# Patient Record
Sex: Male | Born: 1965 | Race: White | Hispanic: No | Marital: Married | Smoking: Never smoker
Health system: Southern US, Community
[De-identification: ages and names within clinical notes are randomized; demographics above are authoritative.]

## PROBLEM LIST (undated history)

## (undated) DIAGNOSIS — E079 Disorder of thyroid, unspecified: Secondary | ICD-10-CM

## (undated) DIAGNOSIS — E119 Type 2 diabetes mellitus without complications: Secondary | ICD-10-CM

## (undated) DIAGNOSIS — E785 Hyperlipidemia, unspecified: Secondary | ICD-10-CM

## (undated) DIAGNOSIS — I10 Essential (primary) hypertension: Secondary | ICD-10-CM

## (undated) DIAGNOSIS — J189 Pneumonia, unspecified organism: Secondary | ICD-10-CM

## (undated) DIAGNOSIS — G4730 Sleep apnea, unspecified: Secondary | ICD-10-CM

## (undated) HISTORY — DX: Pneumonia, unspecified organism: J18.9

## (undated) HISTORY — DX: Essential (primary) hypertension: I10

## (undated) HISTORY — DX: Type 2 diabetes mellitus without complications: E11.9

## (undated) HISTORY — DX: Hyperlipidemia, unspecified: E78.5

## (undated) HISTORY — DX: Sleep apnea, unspecified: G47.30

## (undated) HISTORY — DX: Disorder of thyroid, unspecified: E07.9

---
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
## 2000-12-22 ENCOUNTER — Encounter: Payer: Self-pay | Admitting: Family Medicine

## 2000-12-22 ENCOUNTER — Encounter: Admission: RE | Admit: 2000-12-22 | Discharge: 2000-12-22 | Payer: Self-pay | Admitting: Family Medicine

## 2003-03-24 ENCOUNTER — Emergency Department (HOSPITAL_COMMUNITY): Admission: AD | Admit: 2003-03-24 | Discharge: 2003-03-24 | Payer: Self-pay | Admitting: Emergency Medicine

## 2003-06-24 ENCOUNTER — Inpatient Hospital Stay (HOSPITAL_COMMUNITY): Admission: EM | Admit: 2003-06-24 | Discharge: 2003-06-30 | Payer: Self-pay | Admitting: Emergency Medicine

## 2003-06-27 ENCOUNTER — Encounter (INDEPENDENT_AMBULATORY_CARE_PROVIDER_SITE_OTHER): Payer: Self-pay | Admitting: *Deleted

## 2003-06-29 ENCOUNTER — Ambulatory Visit (HOSPITAL_BASED_OUTPATIENT_CLINIC_OR_DEPARTMENT_OTHER): Admission: RE | Admit: 2003-06-29 | Discharge: 2003-06-29 | Payer: Self-pay | Admitting: Internal Medicine

## 2003-07-05 ENCOUNTER — Encounter: Admission: RE | Admit: 2003-07-05 | Discharge: 2003-07-05 | Payer: Self-pay | Admitting: Internal Medicine

## 2005-12-02 IMAGING — CR DG CHEST 2V
2 series · 2 of 2 positions shown · non-contrast
Comparison: none

CLINICAL DATA: Shortness of breath.
 TWO-VIEW CHEST, 06/25/03
 There are accentuated bronchial markings consistent with bronchitic changes.  There are slight infiltrative changes seen within the medial right lower lobe (shown best on patient?s recent chest CT scan).  The heart is borderline in size.
 IMPRESSION 
 Bronchitic changes and mild patchy infiltrate/atelectasis medial right lower lobe.

[view not recorded (1 of 2)]
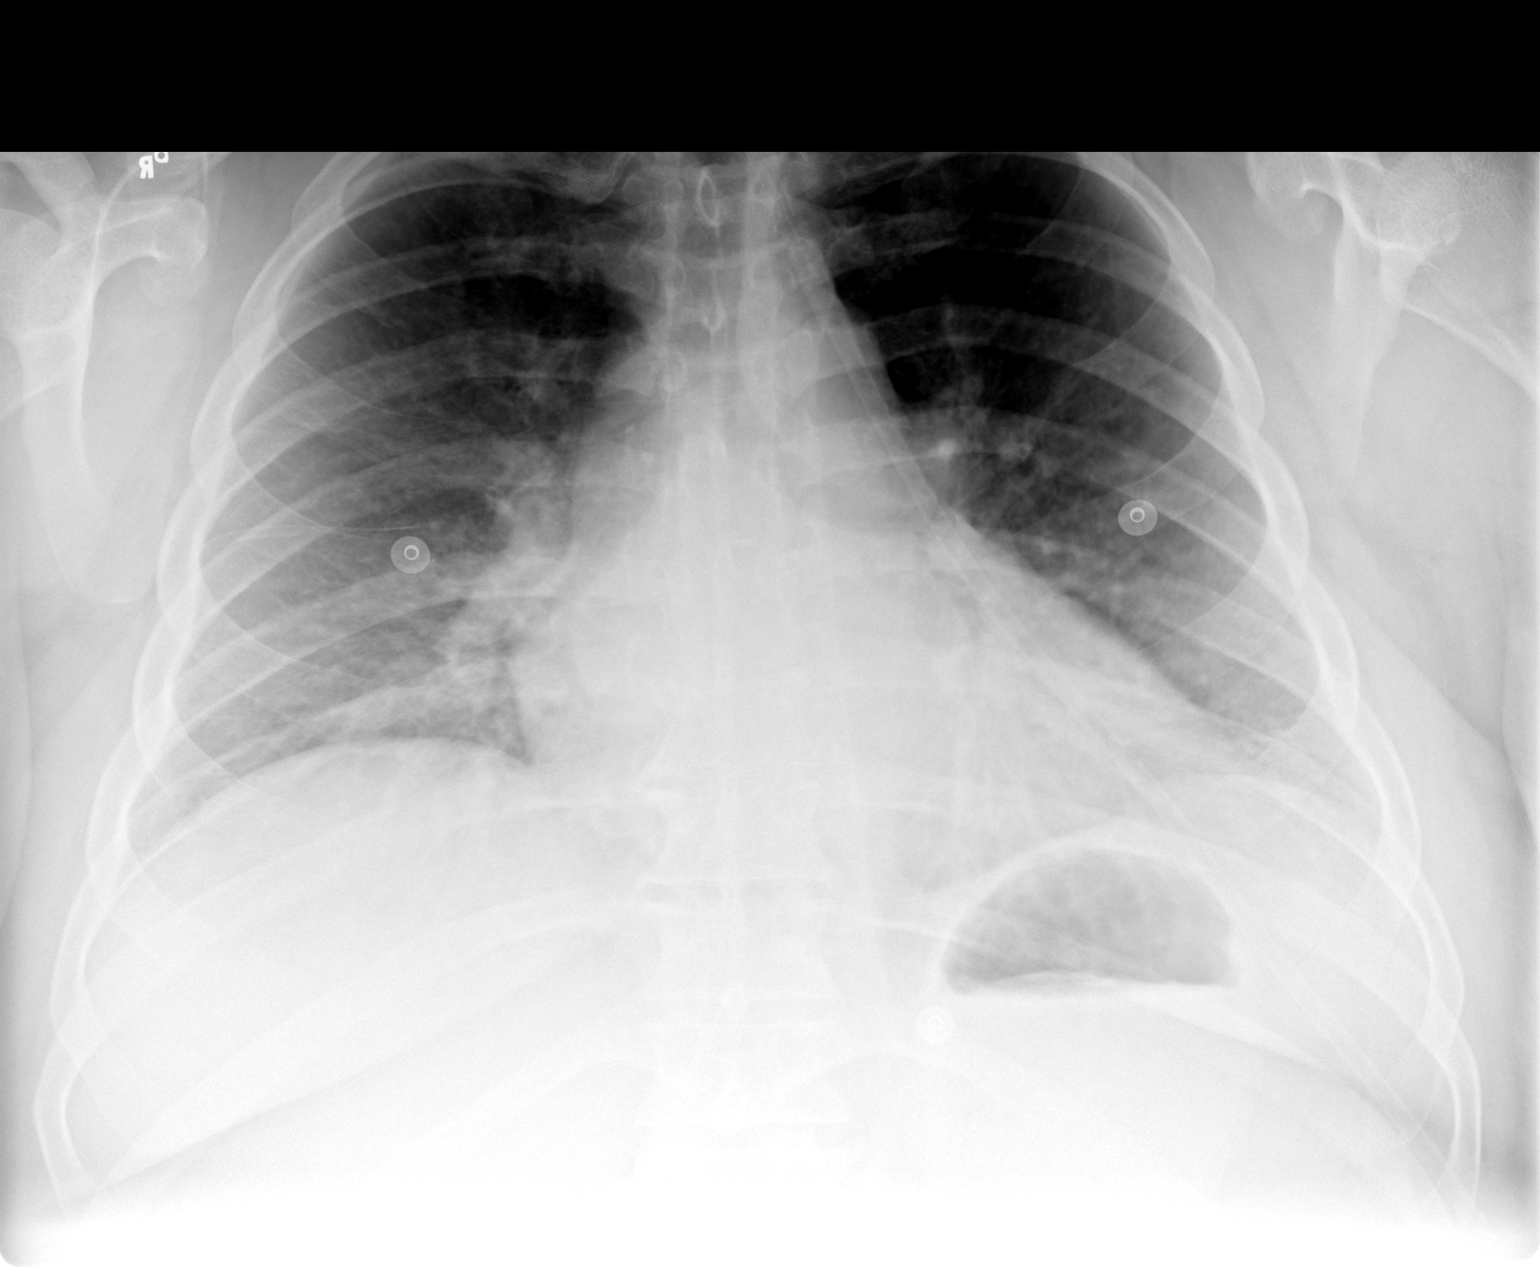

[view not recorded (2 of 2)]
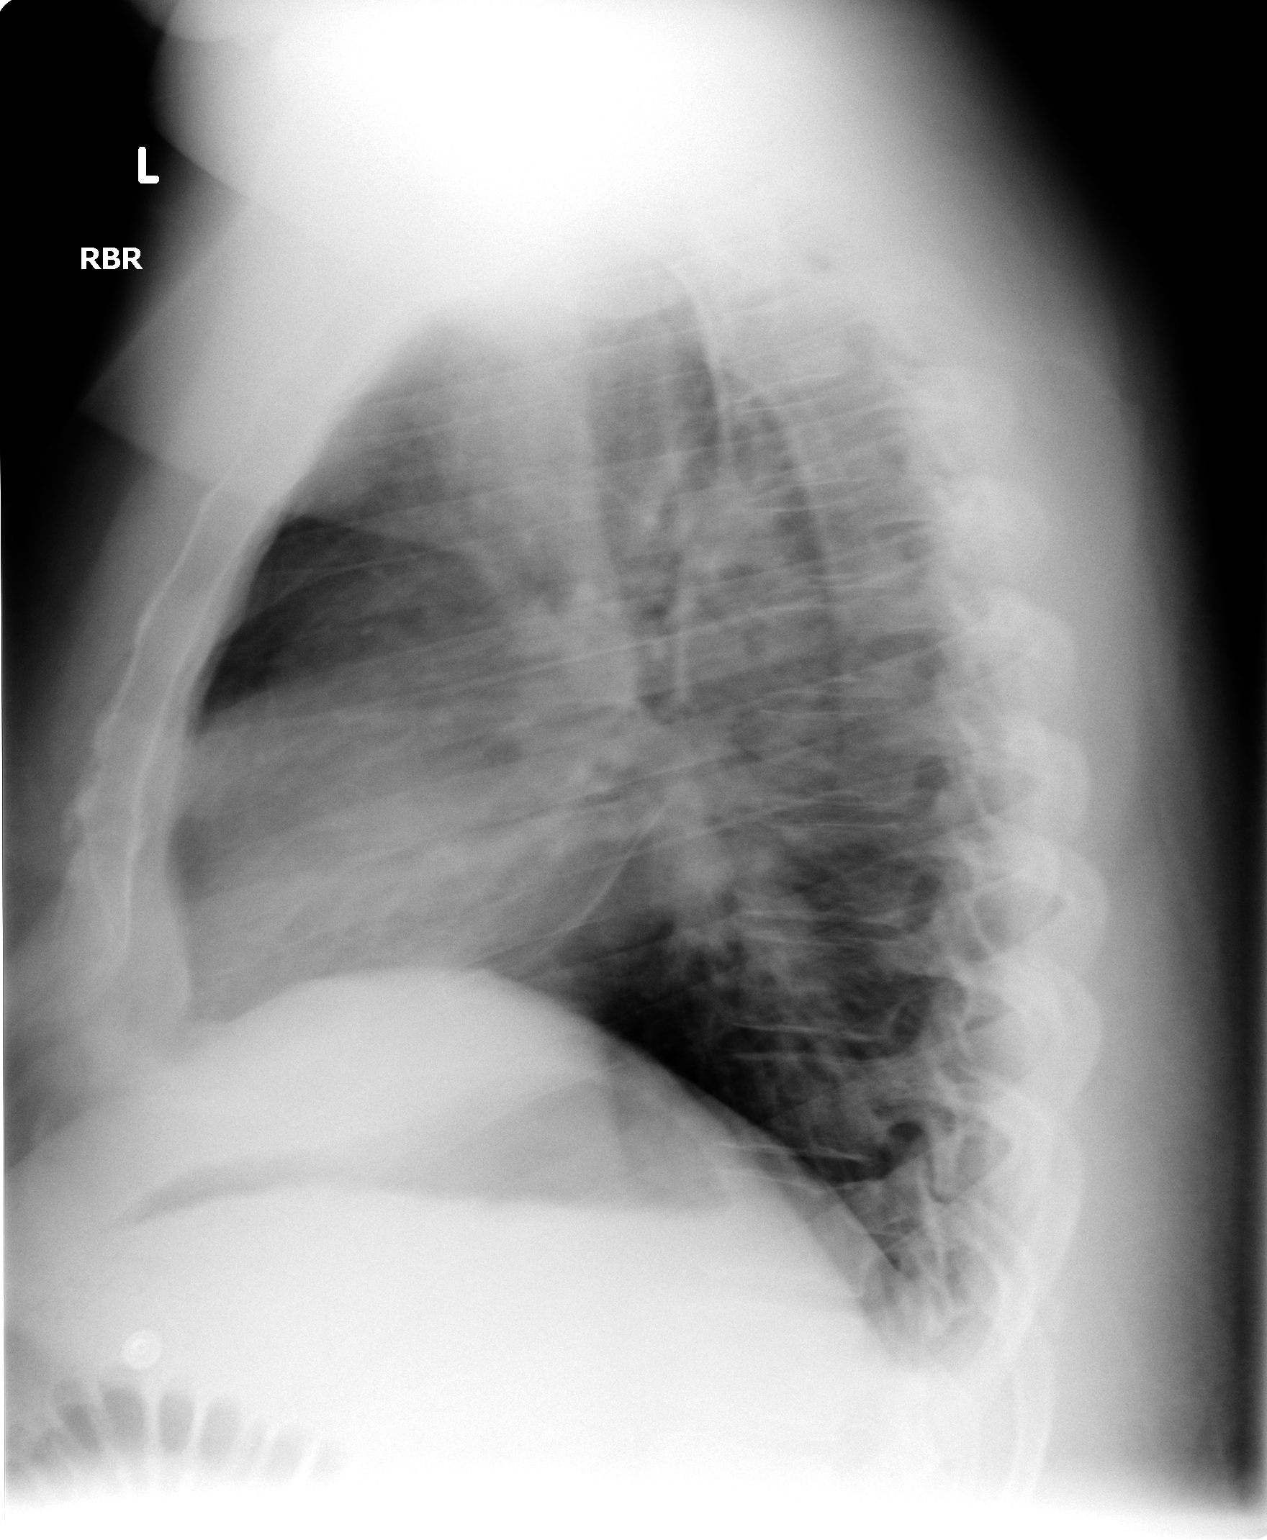

[2 of 2 positions shown; findings below may reference images not displayed]

## 2007-05-14 ENCOUNTER — Ambulatory Visit: Payer: Self-pay | Admitting: Family Medicine

## 2007-05-14 DIAGNOSIS — G4730 Sleep apnea, unspecified: Secondary | ICD-10-CM

## 2007-05-14 DIAGNOSIS — G4733 Obstructive sleep apnea (adult) (pediatric): Secondary | ICD-10-CM | POA: Insufficient documentation

## 2007-05-14 DIAGNOSIS — I10 Essential (primary) hypertension: Secondary | ICD-10-CM | POA: Insufficient documentation

## 2007-05-14 DIAGNOSIS — E039 Hypothyroidism, unspecified: Secondary | ICD-10-CM

## 2007-05-19 ENCOUNTER — Ambulatory Visit: Payer: Self-pay | Admitting: Family Medicine

## 2007-05-20 DIAGNOSIS — E785 Hyperlipidemia, unspecified: Secondary | ICD-10-CM

## 2007-05-20 DIAGNOSIS — E1169 Type 2 diabetes mellitus with other specified complication: Secondary | ICD-10-CM | POA: Insufficient documentation

## 2007-05-20 DIAGNOSIS — E1159 Type 2 diabetes mellitus with other circulatory complications: Secondary | ICD-10-CM | POA: Insufficient documentation

## 2007-05-20 DIAGNOSIS — E119 Type 2 diabetes mellitus without complications: Secondary | ICD-10-CM

## 2007-05-20 LAB — CONVERTED CEMR LAB
ALT: 37 units/L (ref 0–53)
AST: 28 units/L (ref 0–37)
Albumin: 3.6 g/dL (ref 3.5–5.2)
Alkaline Phosphatase: 53 units/L (ref 39–117)
BUN: 13 mg/dL (ref 6–23)
Basophils Absolute: 0 10*3/uL (ref 0.0–0.1)
Basophils Relative: 0.3 % (ref 0.0–1.0)
Bilirubin, Direct: 0.1 mg/dL (ref 0.0–0.3)
CO2: 26 meq/L (ref 19–32)
Calcium: 9.4 mg/dL (ref 8.4–10.5)
Chloride: 101 meq/L (ref 96–112)
Cholesterol: 204 mg/dL (ref 0–200)
Creatinine, Ser: 1 mg/dL (ref 0.4–1.5)
Direct LDL: 150.4 mg/dL
Eosinophils Absolute: 0.1 10*3/uL (ref 0.0–0.6)
Eosinophils Relative: 1.4 % (ref 0.0–5.0)
GFR calc Af Amer: 107 mL/min
GFR calc non Af Amer: 88 mL/min
Glucose, Bld: 126 mg/dL — ABNORMAL HIGH (ref 70–99)
HCT: 42.3 % (ref 39.0–52.0)
HDL: 33.3 mg/dL — ABNORMAL LOW (ref >39.0)
Hemoglobin: 13.9 g/dL (ref 13.0–17.0)
Lymphocytes Relative: 37.3 % (ref 12.0–46.0)
MCHC: 32.9 g/dL (ref 30.0–36.0)
MCV: 87.7 fL (ref 78.0–100.0)
Monocytes Absolute: 0.8 10*3/uL — ABNORMAL HIGH (ref 0.2–0.7)
Monocytes Relative: 7.5 % (ref 3.0–11.0)
Neutro Abs: 5.8 10*3/uL (ref 1.4–7.7)
Neutrophils Relative %: 53.5 % (ref 43.0–77.0)
Platelets: 248 10*3/uL (ref 150–400)
Potassium: 4.2 meq/L (ref 3.5–5.1)
RBC: 4.83 M/uL (ref 4.22–5.81)
RDW: 12.9 % (ref 11.5–14.6)
Sodium: 137 meq/L (ref 135–145)
TSH: 52.65 microintl units/mL — ABNORMAL HIGH (ref 0.35–5.50)
Total Bilirubin: 0.7 mg/dL (ref 0.3–1.2)
Total CHOL/HDL Ratio: 6.1
Total Protein: 7.7 g/dL (ref 6.0–8.3)
Triglycerides: 101 mg/dL (ref 0–149)
VLDL: 20 mg/dL (ref 0–40)
WBC: 10.7 10*3/uL — ABNORMAL HIGH (ref 4.5–10.5)

## 2007-06-05 ENCOUNTER — Ambulatory Visit: Payer: Self-pay | Admitting: Family Medicine

## 2007-07-02 ENCOUNTER — Ambulatory Visit: Payer: Self-pay | Admitting: Family Medicine

## 2007-07-06 ENCOUNTER — Telehealth: Payer: Self-pay | Admitting: Family Medicine

## 2007-07-06 LAB — CONVERTED CEMR LAB
BUN: 15 mg/dL (ref 6–23)
CO2: 28 meq/L (ref 19–32)
Calcium: 9.1 mg/dL (ref 8.4–10.5)
Chloride: 103 meq/L (ref 96–112)
Creatinine, Ser: 0.8 mg/dL (ref 0.4–1.5)
GFR calc Af Amer: 138 mL/min
GFR calc non Af Amer: 114 mL/min
Glucose, Bld: 143 mg/dL — ABNORMAL HIGH (ref 70–99)
Potassium: 4.1 meq/L (ref 3.5–5.1)
Sodium: 138 meq/L (ref 135–145)
TSH: 10.13 microintl units/mL — ABNORMAL HIGH (ref 0.35–5.50)

## 2007-08-19 ENCOUNTER — Ambulatory Visit: Payer: Self-pay | Admitting: Family Medicine

## 2007-08-23 LAB — CONVERTED CEMR LAB: TSH: 10.61 microintl units/mL — ABNORMAL HIGH (ref 0.35–5.50)

## 2007-09-08 ENCOUNTER — Ambulatory Visit: Payer: Self-pay | Admitting: Family Medicine

## 2007-09-10 ENCOUNTER — Ambulatory Visit: Payer: Self-pay | Admitting: Family Medicine

## 2007-09-10 LAB — CONVERTED CEMR LAB
Cholesterol, target level: 200 mg/dL
HDL goal, serum: 40 mg/dL
LDL Goal: 100 mg/dL

## 2007-09-11 LAB — CONVERTED CEMR LAB
ALT: 41 units/L (ref 0–53)
AST: 30 units/L (ref 0–37)
Albumin: 3.1 g/dL — ABNORMAL LOW (ref 3.5–5.2)
Alkaline Phosphatase: 56 units/L (ref 39–117)
Bilirubin, Direct: 0.1 mg/dL (ref 0.0–0.3)
Cholesterol: 145 mg/dL (ref 0–200)
Creatinine,U: 200.9 mg/dL
HDL: 28.4 mg/dL — ABNORMAL LOW (ref >39.0)
Hgb A1c MFr Bld: 7.6 % — ABNORMAL HIGH (ref 4.6–6.0)
LDL Cholesterol: 97 mg/dL (ref 0–99)
Microalb Creat Ratio: 2.5 mg/g (ref 0.0–30.0)
Microalb, Ur: 0.5 mg/dL (ref 0.0–1.9)
Total Bilirubin: 0.8 mg/dL (ref 0.3–1.2)
Total CHOL/HDL Ratio: 5.1
Total Protein: 7.6 g/dL (ref 6.0–8.3)
Triglycerides: 100 mg/dL (ref 0–149)
VLDL: 20 mg/dL (ref 0–40)

## 2007-10-01 ENCOUNTER — Ambulatory Visit: Payer: Self-pay | Admitting: Family Medicine

## 2007-10-15 ENCOUNTER — Ambulatory Visit: Payer: Self-pay | Admitting: Family Medicine

## 2007-10-20 LAB — CONVERTED CEMR LAB: TSH: 2.96 microintl units/mL (ref 0.35–5.50)

## 2007-12-09 ENCOUNTER — Encounter (INDEPENDENT_AMBULATORY_CARE_PROVIDER_SITE_OTHER): Payer: Self-pay | Admitting: *Deleted

## 2007-12-14 ENCOUNTER — Encounter (INDEPENDENT_AMBULATORY_CARE_PROVIDER_SITE_OTHER): Payer: Self-pay | Admitting: *Deleted

## 2007-12-21 ENCOUNTER — Telehealth: Payer: Self-pay | Admitting: Family Medicine

## 2007-12-23 ENCOUNTER — Ambulatory Visit: Payer: Self-pay | Admitting: Family Medicine

## 2007-12-23 LAB — CONVERTED CEMR LAB: Hgb A1c MFr Bld: 7.5 % — ABNORMAL HIGH (ref 4.6–6.0)

## 2007-12-29 ENCOUNTER — Ambulatory Visit: Payer: Self-pay | Admitting: Family Medicine

## 2009-05-02 ENCOUNTER — Telehealth (INDEPENDENT_AMBULATORY_CARE_PROVIDER_SITE_OTHER): Payer: Self-pay | Admitting: *Deleted

## 2009-05-03 ENCOUNTER — Ambulatory Visit: Payer: Self-pay | Admitting: Family Medicine

## 2010-04-20 ENCOUNTER — Encounter: Payer: Self-pay | Admitting: Family Medicine

## 2010-04-29 LAB — CONVERTED CEMR LAB: Hgb A1c MFr Bld: 7.5 % — ABNORMAL HIGH (ref 4.6–6.0)

## 2010-05-01 NOTE — Progress Notes (Signed)
Summary: made app for tomorrow  Phone Note Call from Patient   Caller: Patient Call For: Kerby Nora MD Summary of Call: Patient called requesting appointment for today or medication be called in for him. He said that he has had a cold for the past 4 days, he has had cough, had fever last Thursday. He says that he needs something for the cough because it will just not go away.  I scheduled him an appointment wiht Dr. Dayton Martes for tomorrow. He said that he was okay with that.  Initial call taken by: Melody Comas,  May 02, 2009 10:37 AM

## 2010-05-01 NOTE — Assessment & Plan Note (Signed)
Summary: cold, chest rattle, coughing   Vital Signs:  Patient profile:   45 year old male Height:      72 inches Weight:      423.25 pounds BMI:     57.61 Temp:     98.7 degrees F oral Pulse rate:   92 / minute Pulse rhythm:   regular BP sitting:   132 / 90  (left arm) Cuff size:   large  Vitals Entered By: Delilah Shan CMA Duncan Dull) (May 03, 2009 10:35 AM) CC: Cold, chest rattling, coughing   History of Present Illness: 45 yo with 8 days of subjective fevers, chills, runny nose, and productive cough. Fevers have gone away but feels cough and wheezing has gotten worse over past couple of days. Trying OTC mucinex with no relief of symptoms. No shortness of breath. NO sore throat or ear pain. No n/v/d.  Current Medications (verified): 1)  Synthroid 200 Mcg  Tabs (Levothyroxine Sodium) .... Take 1 Tablet By Mouth Once A Day 2)  Lisinopril-Hydrochlorothiazide 10-12.5 Mg  Tabs (Lisinopril-Hydrochlorothiazide) .Marland Kitchen.. 1 Tab By Mouth Daily 3)  Simvastatin 40 Mg  Tabs (Simvastatin) .... Take 1 Tablet By Mouth Once A Day 4)  Metformin Hcl 1000 Mg Tabs (Metformin Hcl) .Marland Kitchen.. 1 Tab By Mouth Two Times A Day 5)  Synthroid 50 Mcg  Tabs (Levothyroxine Sodium) .... Take One Tablet Daily 6)  Azithromycin 250 Mg  Tabs (Azithromycin) .... 2 By  Mouth Today and Then 1 Daily For 4 Days 7)  Cheratussin Ac 100-10 Mg/32ml Syrp (Guaifenesin-Codeine) .... 5 Ml Two Times A Day As Needed For Cough  Allergies (verified): No Known Drug Allergies  Review of Systems      See HPI General:  Complains of chills and fever. ENT:  Complains of nasal congestion and sinus pressure; denies earache and sore throat. CV:  Denies chest pain or discomfort. Resp:  Complains of cough and sputum productive; denies shortness of breath and wheezing. GI:  Denies diarrhea, nausea, and vomiting.  Physical Exam  General:  morbidly obese male in NAD Non toxic. VS reviewed- afebrile and normotensive. Ears:  TM retracted  bilaterally. Nose:  nasal dischargemucosal pallor.   Mouth:  Oral mucosa and oropharynx without lesions or exudates.  Teeth in good repair. MMM Lungs:  Normal respiratory effort, chest expands symmetrically. No crackles. Occassional exp wheeze. Heart:  Normal rate and regular rhythm. S1 and S2 normal without gallop, murmur, click, rub or other extra sounds. Extremities:  no edema Psych:  Cognition and judgment appear intact. Alert and cooperative with normal attention span and concentration. No apparent delusions, illusions, hallucinations   Impression & Recommendations:  Problem # 1:  ACUTE BRONCHITIS (ICD-466.0) Assessment New Given duration of symptoms, will treat with Zpack. Chertussin for cough. Continue supportive care.  See patient instructions for details. The following medications were removed from the medication list:    Cheratussin Ac 100-10 Mg/3ml Syrp (Guaifenesin-codeine) .Marland Kitchen... 1-2 tsp by mouth at bedtime as needed cough His updated medication list for this problem includes:    Azithromycin 250 Mg Tabs (Azithromycin) .Marland Kitchen... 2 by  mouth today and then 1 daily for 4 days    Cheratussin Ac 100-10 Mg/46ml Syrp (Guaifenesin-codeine) .Marland KitchenMarland KitchenMarland KitchenMarland Kitchen 5 ml two times a day as needed for cough  Complete Medication List: 1)  Synthroid 200 Mcg Tabs (Levothyroxine sodium) .... Take 1 tablet by mouth once a day 2)  Lisinopril-hydrochlorothiazide 10-12.5 Mg Tabs (Lisinopril-hydrochlorothiazide) .Marland Kitchen.. 1 tab by mouth daily 3)  Simvastatin 40 Mg  Tabs (Simvastatin) .... Take 1 tablet by mouth once a day 4)  Metformin Hcl 1000 Mg Tabs (Metformin hcl) .Marland Kitchen.. 1 tab by mouth two times a day 5)  Synthroid 50 Mcg Tabs (Levothyroxine sodium) .... Take one tablet daily 6)  Azithromycin 250 Mg Tabs (Azithromycin) .... 2 by  mouth today and then 1 daily for 4 days 7)  Cheratussin Ac 100-10 Mg/62ml Syrp (Guaifenesin-codeine) .... 5 ml two times a day as needed for cough  Patient Instructions: 1)  Take antibiotic as  directed.  Drink lots of fluids.  Treat sympotmatically with Mucinex, nasal saline irrigation, and Tylenol/Ibuprofen. You can also try using warm compresses.  Cough suppressant at night. Call if not improving as expected in 5-7 days.  Prescriptions: CHERATUSSIN AC 100-10 MG/5ML SYRP (GUAIFENESIN-CODEINE) 5 ml two times a day as needed for cough  #4 ounces x 0   Entered and Authorized by:   Ruthe Mannan MD   Signed by:   Ruthe Mannan MD on 05/03/2009   Method used:   Print then Give to Patient   RxID:   [id number] AZITHROMYCIN 250 MG  TABS (AZITHROMYCIN) 2 by  mouth today and then 1 daily for 4 days  #6 x 0   Entered and Authorized by:   Ruthe Mannan MD   Signed by:   Ruthe Mannan MD on 05/03/2009   Method used:   Electronically to        [id number] * (retail)       [address]       [place], [place]  [address]       Ph: [phone] or [phone]       Fax: [phone]   RxID:   [id number]   Current Allergies (reviewed today): No known allergies

## 2010-05-17 NOTE — Letter (Signed)
Summary: [facility]  [facility]   Imported By: Kassie Mends 05/07/2010 11:02:54  _____________________________________________________________________  External Attachment:    Type:   Image     Comment:   External Document

## 2010-06-01 ENCOUNTER — Telehealth (INDEPENDENT_AMBULATORY_CARE_PROVIDER_SITE_OTHER): Payer: Self-pay | Admitting: *Deleted

## 2010-06-04 ENCOUNTER — Other Ambulatory Visit: Payer: Self-pay | Admitting: Family Medicine

## 2010-06-04 ENCOUNTER — Encounter (INDEPENDENT_AMBULATORY_CARE_PROVIDER_SITE_OTHER): Payer: Self-pay | Admitting: *Deleted

## 2010-06-04 ENCOUNTER — Other Ambulatory Visit (INDEPENDENT_AMBULATORY_CARE_PROVIDER_SITE_OTHER): Payer: BC Managed Care – PPO

## 2010-06-04 DIAGNOSIS — E039 Hypothyroidism, unspecified: Secondary | ICD-10-CM

## 2010-06-04 DIAGNOSIS — E119 Type 2 diabetes mellitus without complications: Secondary | ICD-10-CM

## 2010-06-04 DIAGNOSIS — E785 Hyperlipidemia, unspecified: Secondary | ICD-10-CM

## 2010-06-04 DIAGNOSIS — I10 Essential (primary) hypertension: Secondary | ICD-10-CM

## 2010-06-04 LAB — HEPATIC FUNCTION PANEL
ALT: 15 U/L (ref 0–53)
AST: 19 U/L (ref 0–37)
Albumin: 3.8 g/dL (ref 3.5–5.2)
Alkaline Phosphatase: 53 U/L (ref 39–117)
Bilirubin, Direct: 0.1 mg/dL (ref 0.0–0.3)
Total Bilirubin: 0.7 mg/dL (ref 0.3–1.2)
Total Protein: 7.3 g/dL (ref 6.0–8.3)

## 2010-06-04 LAB — LIPID PANEL
Cholesterol: 144 mg/dL (ref 0–200)
HDL: 35.9 mg/dL — ABNORMAL LOW (ref >39.00)
LDL Cholesterol: 97 mg/dL (ref 0–99)
Total CHOL/HDL Ratio: 4
Triglycerides: 55 mg/dL (ref 0.0–149.0)
VLDL: 11 mg/dL (ref 0.0–40.0)

## 2010-06-04 LAB — BASIC METABOLIC PANEL
BUN: 12 mg/dL (ref 6–23)
CO2: 26 mEq/L (ref 19–32)
Calcium: 9 mg/dL (ref 8.4–10.5)
Chloride: 103 mEq/L (ref 96–112)
Creatinine, Ser: 0.8 mg/dL (ref 0.4–1.5)
GFR: 117.41 mL/min (ref >60.00)
Glucose, Bld: 98 mg/dL (ref 70–99)
Potassium: 4.2 mEq/L (ref 3.5–5.1)
Sodium: 137 mEq/L (ref 135–145)

## 2010-06-04 LAB — TSH: TSH: 7.41 u[IU]/mL — ABNORMAL HIGH (ref 0.35–5.50)

## 2010-06-04 LAB — HEMOGLOBIN A1C: Hgb A1c MFr Bld: 6.6 % — ABNORMAL HIGH (ref 4.6–6.5)

## 2010-06-05 ENCOUNTER — Telehealth: Payer: Self-pay | Admitting: Family Medicine

## 2010-06-07 NOTE — Progress Notes (Signed)
----   Converted from flag ---- ---- 05/29/2010 1:56 PM, Kerby Nora MD wrote: CMET, lipids A1C, microalbumin, TSH Dx 250.00, 244.9  ---- 05/29/2010 1:28 PM, Liane Comber CMA (AAMA) wrote: Lab orders please! Good Morning! This pt is scheduled for cpx labs Monday, which labs to draw and dx codes to use? Thanks Tasha ------------------------------

## 2010-06-08 ENCOUNTER — Encounter: Payer: Self-pay | Admitting: Family Medicine

## 2010-06-08 ENCOUNTER — Encounter (INDEPENDENT_AMBULATORY_CARE_PROVIDER_SITE_OTHER): Payer: BC Managed Care – PPO | Admitting: Family Medicine

## 2010-06-08 DIAGNOSIS — Z0000 Encounter for general adult medical examination without abnormal findings: Secondary | ICD-10-CM

## 2010-06-11 ENCOUNTER — Encounter: Payer: Self-pay | Admitting: Family Medicine

## 2010-06-12 NOTE — Progress Notes (Signed)
  Phone Note Outgoing Call   Call placed by: twalsh Summary of Call: FYI patient never brought his urine sample back for a Microalbumin, it was cancelled. I will reorder if he brings it in.  Initial call taken by: Mills Koller,  June 05, 2010 2:56 PM  Follow-up for Phone Call        Has upcoming appt 3/9.. . of note he is on ACEI already... can hold off on microalbumin check  Follow-up by: Kerby Nora MD,  June 05, 2010 3:32 PM

## 2010-06-19 NOTE — Assessment & Plan Note (Signed)
Vital Signs:  Patient profile:   45 year old male Height:      72 inches Weight:      354.75 pounds BMI:     48.29 Temp:     97.6 degrees F oral Pulse rate:   88 / minute Pulse rhythm:   regular BP sitting:   110 / 72  (left arm) Cuff size:   large  Vitals Entered By: Benny Lennert CMA Duncan Dull) (June 08, 2010 11:54 AM)  History of Present Illness: Chief complaint cpx  The patient is here for annual wellness exam and preventative care.    He has not been seen since 2009. Has lost 80 lbs in last 3 years.  Regualr exercsie and on diet.    DM: well controlled on metformin    HTN, well controlled...on lisinopril/HCTZ  High cholesterol, well controlled on simvastatin   Hypothyroid.. undertreated on current dose of synthroid.... but has only been using 200 micrograms.. not the additional 50 micrograms.   Dx with sleep apnea 8 years ago... on CPAP...has not had reeval in 8 years.  Hypertension History:      He denies headache, chest pain, palpitations, dyspnea with exertion, peripheral edema, neurologic problems, syncope, and side effects from treatment.        Positive major cardiovascular risk factors include diabetes, hyperlipidemia, and hypertension.  Negative major cardiovascular risk factors include male age less than 56 years old and non-tobacco-user status.    Lipid Management History:      Positive NCEP/ATP III risk factors include diabetes, HDL cholesterol less than 40, and hypertension.  Negative NCEP/ATP III risk factors include male age less than 36 years old and non-tobacco-user status.       Problems Prior to Update: 1)  Acute Bronchitis  (ICD-466.0) 2)  Hyperlipidemia  (ICD-272.4) 3)  Diabetes Mellitus, Type II, Without Complications  (ICD-250.00) 4)  Morbid Obesity  (ICD-278.01) 5)  Family History of Cad Male 1st Degree Relative <50  (ICD-V17.3) 6)  Sleep Apnea  (ICD-780.57) 7)  Hypothyroidism  (ICD-244.9) 8)  Hypertension  (ICD-401.9)  Current  Medications (verified): 1)  Synthroid 200 Mcg  Tabs (Levothyroxine Sodium) .... Take 1 Tablet By Mouth Once A Day 2)  Lisinopril-Hydrochlorothiazide 10-12.5 Mg  Tabs (Lisinopril-Hydrochlorothiazide) .Marland Kitchen.. 1 Tab By Mouth Daily 3)  Simvastatin 40 Mg  Tabs (Simvastatin) .... Take 1 Tablet By Mouth Once A Day 4)  Metformin Hcl 1000 Mg Tabs (Metformin Hcl) .Marland Kitchen.. 1 Tab By Mouth Two Times A Day 5)  Synthroid 50 Mcg  Tabs (Levothyroxine Sodium) .... Take One Tablet Daily  Allergies (verified): No Known Drug Allergies  Past History:  Past medical, surgical, family and social histories (including risk factors) reviewed, and no changes noted (except as noted below).  Past Medical History: Reviewed history from 05/14/2007 and no changes required. Hypertension Hypothyroidism  Past Surgical History: Reviewed history from 05/14/2007 and no changes required. Hospitalization 2006 HTN, PNA Tonsillectomy  Family History: Reviewed history from 05/14/2007 and no changes required. mother: DM, HTN father: DM, HTN, MI at age 27 PGM: DM Family History of CAD Male 1st degree relative <50 no cancer  Social History: Reviewed history from 05/14/2007 and no changes required. Occupation:lab tech for [employer] Married Childern: healthy Never Smoked Alcohol use-no Drug use-no Regular exercise-no Diet: eats on the run, some fast food, working on changing  Diabetes Management Exam:    Foot Exam (with socks and/or shoes not present):       Sensory-Pinprick/Light touch:  Left medial foot (L-4): normal          Left dorsal foot (L-5): normal          Left lateral foot (S-1): normal          Right medial foot (L-4): normal          Right dorsal foot (L-5): normal          Right lateral foot (S-1): normal       Sensory-Monofilament:          Left foot: normal          Right foot: normal       Inspection:          Left foot: normal          Right foot: normal       Nails:          Left foot:  normal          Right foot: normal    Eye Exam:       Eye Exam done elsewhere          Date: 10/30/2009          Results: normal          Done by: eye MD    Impression & Recommendations:  Problem # 1:  Preventive Health Care (ICD-V70.0) The patient's preventative maintenance and recommended screening tests for an annual wellness exam were reviewed in full today. Brought up to date unless services declined.  Counselled on the importance of diet, exercise, and its role in overall health and mortality. The patient's FH and SH was reviewed, including their home life, tobacco status, and drug and alcohol status.   Given his morbid obesity.. I stressed the improtance of weight loss for overall health.Marland Kitchen all his healthy issues could improve with weight loss. Encouraged exercise, weight loss, healthy eating habits.     Problem # 2:  HYPERLIPIDEMIA (ICD-272.4) Well controlled. Continue current medication.  His updated medication list for this problem includes:    Simvastatin 40 Mg Tabs (Simvastatin) .Marland Kitchen... Take 1 tablet by mouth once a day  Labs Reviewed: SGOT: 19 (06/04/2010)   SGPT: 15 (06/04/2010)  Lipid Goals: Chol Goal: 200 (09/10/2007)   HDL Goal: 40 (09/10/2007)   LDL Goal: 100 (09/10/2007)   TG Goal: 150 (09/10/2007)  10 Yr Risk Heart Disease: 4 % Prior 10 Yr Risk Heart Disease: 6 % (12/29/2007)   HDL:35.90 (06/04/2010), 28.4 (09/08/2007)  LDL:97 (06/04/2010), 97 (09/08/2007)  Chol:144 (06/04/2010), 145 (09/08/2007)  Trig:55.0 (06/04/2010), 100 (09/08/2007)  Problem # 3:  DIABETES MELLITUS, TYPE II, WITHOUT COMPLICATIONS (ICD-250.00) Well controlled. Continue current medication.  His updated medication list for this problem includes:    Lisinopril-hydrochlorothiazide 10-12.5 Mg Tabs (Lisinopril-hydrochlorothiazide) .Marland Kitchen... 1 tab by mouth daily    Metformin Hcl 1000 Mg Tabs (Metformin hcl) .Marland Kitchen... 1 tab by mouth two times a day  Problem # 4:  HYPERTENSION (ICD-401.9) Well  controlled. Continue current medication.  His updated medication list for this problem includes:    Lisinopril-hydrochlorothiazide 10-12.5 Mg Tabs (Lisinopril-hydrochlorothiazide) .Marland Kitchen... 1 tab by mouth daily  BP today: 110/72 Prior BP: 132/90 (05/03/2009)  10 Yr Risk Heart Disease: 4 % Prior 10 Yr Risk Heart Disease: 6 % (12/29/2007)  Labs Reviewed: K+: 4.2 (06/04/2010) Creat: : 0.8 (06/04/2010)   Chol: 144 (06/04/2010)   HDL: 35.90 (06/04/2010)   LDL: 97 (06/04/2010)   TG: 55.0 (06/04/2010)  Problem # 5:  HYPOTHYROIDISM (ICD-244.9) Undertreated but not  taking recommended amount of  medication.  Get back on 250 micrograms daily of synthroid. His updated medication list for this problem includes:    Synthroid 200 Mcg Tabs (Levothyroxine sodium) .Marland Kitchen... Take 1 tablet by mouth once a day    Synthroid 50 Mcg Tabs (Levothyroxine sodium) .Marland Kitchen... Take one tablet daily  Problem # 6:  SLEEP APNEA (ICD-780.57) Given 80 lbs weight loss.. he need adjustment of CPAP for correct pressures, fit etc. Referral made.  Orders: Sleep Disorder Referral (Sleep Disorder)  Complete Medication List: 1)  Synthroid 200 Mcg Tabs (Levothyroxine sodium) .... Take 1 tablet by mouth once a day 2)  Lisinopril-hydrochlorothiazide 10-12.5 Mg Tabs (Lisinopril-hydrochlorothiazide) .Marland Kitchen.. 1 tab by mouth daily 3)  Simvastatin 40 Mg Tabs (Simvastatin) .... Take 1 tablet by mouth once a day 4)  Metformin Hcl 1000 Mg Tabs (Metformin hcl) .Marland Kitchen.. 1 tab by mouth two times a day 5)  Synthroid 50 Mcg Tabs (Levothyroxine sodium) .... Take one tablet daily  Hypertension Assessment/Plan:      The patient's hypertensive risk group is category C: Target organ damage and/or diabetes.  His calculated 10 year risk of coronary heart disease is 4 %.  Today's blood pressure is 110/72.  His blood pressure goal is < 130/80.  Lipid Assessment/Plan:      Based on NCEP/ATP III, the patient's risk factor category is "history of diabetes".  The  patient's lipid goals are as follows: Total cholesterol goal is 200; LDL cholesterol goal is 100; HDL cholesterol goal is 40; Triglyceride goal is 150.  His LDL cholesterol goal has been met.    Patient Instructions: 1)  COntinue excellent work on weight loss and diet changes. 2)   Continue regular exercsie.  3)  Restart 50 micrograms with the 200 micrograms. 4)   Recheck TSH  in 4-6 weeks Dx 244.9 5)   Please schedule a follow-up appointment in 6 months .  6)  BMP prior to visit, ICD-9: 250.00 7)  Hepatic Panel prior to visit ICD-9:  8)  Lipid panel prior to visit ICD-9 :  9)  HgBA1c prior to visit  ICD-9:  10)  Referral Appointment Information 11)  Day/Date: 12)  Time: 13)  Place/MD: 14)  Address: 15)  Phone/Fax: 16)  Patient given appointment information. Information/Orders faxed/mailed.    Orders Added: 1)  Sleep Disorder Referral [Sleep Disorder] 2)  Est. Patient 40-64 years [[id number]]    Current Allergies (reviewed today): No known allergies   Last Flu Vaccine:  Historical (12/31/2006 10:54:31 AM) Flu Vaccine Result Date:  12/30/2009 Flu Vaccine Result:  given Flu Vaccine Next Due:  1 yr    Past Medical History:    Reviewed history from 05/14/2007 and no changes required:       Hypertension       Hypothyroidism  Past Surgical History:    Reviewed history from 05/14/2007 and no changes required:       Hospitalization 2006 HTN, PNA       Tonsillectomy

## 2010-07-03 ENCOUNTER — Encounter: Payer: Self-pay | Admitting: Pulmonary Disease

## 2010-07-05 ENCOUNTER — Ambulatory Visit (INDEPENDENT_AMBULATORY_CARE_PROVIDER_SITE_OTHER): Payer: BC Managed Care – PPO | Admitting: Pulmonary Disease

## 2010-07-05 ENCOUNTER — Other Ambulatory Visit: Payer: Self-pay | Admitting: *Deleted

## 2010-07-05 ENCOUNTER — Encounter: Payer: Self-pay | Admitting: Pulmonary Disease

## 2010-07-05 VITALS — BP 100/76 | HR 68 | Temp 98.1°F | Ht 72.0 in | Wt 348.0 lb

## 2010-07-05 DIAGNOSIS — G4730 Sleep apnea, unspecified: Secondary | ICD-10-CM

## 2010-07-05 MED ORDER — LEVOTHYROXINE SODIUM 50 MCG PO TABS: 50.0000 ug | ORAL_TABLET | Freq: Every day | ORAL | Status: DC

## 2010-07-05 NOTE — Progress Notes (Signed)
  Subjective:    Patient ID: [name], male    DOB: 1965/05/07, 45 y.o.   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  HENT: Negative for ear pain, congestion, sore throat, rhinorrhea, sneezing, trouble swallowing, dental problem and postnasal drip.   Eyes: Negative for redness.  Respiratory: Negative for cough, shortness of breath and wheezing.   Cardiovascular: Negative for chest pain, palpitations and leg swelling.  Gastrointestinal: Negative for nausea, vomiting, abdominal pain and diarrhea.  Genitourinary: Negative for dysuria and urgency.  Musculoskeletal: Negative for joint swelling.  Skin: Negative for rash.  Neurological: Negative for syncope and headaches.  Hematological: Does not bruise/bleed easily.  Psychiatric/Behavioral: Negative for dysphoric mood. The patient is not nervous/anxious.        Objective:   Physical Exam Gen. Pleasant, well-nourished, in no distress, normal affect ENT - no lesions, no post nasal drip, class 3 airway Neck: No JVD, no thyromegaly, no carotid bruits Lungs: no use of accessory muscles, no dullness to percussion, clear without rales or rhonchi  Cardiovascular: Rhythm regular, heart sounds  normal, no murmurs or gallops, no peripheral edema Abdomen: soft and non-tender, no hepatosplenomegaly, BS  normal. Musculoskeletal: No deformities, no cyanosis or clubbing Neuro:  alert, non focal        Assessment & Plan:

## 2010-07-05 NOTE — Assessment & Plan Note (Addendum)
Rpt CPAP titration gioven wt loss of 80 lbs Given excessive daytime somnolence, narrow pharyngeal exam, witnessed apneas & loud snoring, obstructive sleep apnea is very likely & an overnight polysomnogram will be scheduled as a split study. The pathophysiology of obstructive sleep apnea , it's cardiovascular consequences & modes of treatment including CPAP were discused with the patient in detail & they evidenced understanding.  Weight loss encouraged, compliance with goal of at least 4-6 hrs every night is the expectation. Advised against medications with sedative side effects Cautioned against driving when sleepy - understanding that sleepiness will vary on a day to day basis

## 2010-07-05 NOTE — Patient Instructions (Signed)
We will set you up for  A cpap titration study  We will try to get you a new machine after this study

## 2010-08-04 ENCOUNTER — Other Ambulatory Visit: Payer: Self-pay | Admitting: Family Medicine

## 2010-08-06 ENCOUNTER — Other Ambulatory Visit: Payer: Self-pay | Admitting: *Deleted

## 2010-08-06 MED ORDER — METFORMIN HCL 1000 MG PO TABS: 500.0000 mg | ORAL_TABLET | Freq: Two times a day (BID) | ORAL | Status: DC

## 2010-08-07 ENCOUNTER — Ambulatory Visit (HOSPITAL_BASED_OUTPATIENT_CLINIC_OR_DEPARTMENT_OTHER): Payer: BC Managed Care – PPO | Attending: Pulmonary Disease

## 2010-08-07 DIAGNOSIS — Z79899 Other long term (current) drug therapy: Secondary | ICD-10-CM | POA: Insufficient documentation

## 2010-08-07 DIAGNOSIS — G4733 Obstructive sleep apnea (adult) (pediatric): Secondary | ICD-10-CM | POA: Insufficient documentation

## 2010-08-10 ENCOUNTER — Telehealth: Payer: Self-pay | Admitting: Pulmonary Disease

## 2010-08-10 DIAGNOSIS — Z9989 Dependence on other enabling machines and devices: Secondary | ICD-10-CM

## 2010-08-10 DIAGNOSIS — G4733 Obstructive sleep apnea (adult) (pediatric): Secondary | ICD-10-CM

## 2010-08-10 NOTE — Telephone Encounter (Signed)
Let him know sleep study showed CPAP requirement of 17 cm , with C-flex 2 cm  order sent for DME to change pressure

## 2010-08-11 NOTE — Procedures (Addendum)
[name], [name]              ACCOUNT NO.:  [id number]  MEDICAL RECORD NO.:  [id number]          PATIENT TYPE:  OUT  LOCATION:  SLEEP CENTER                 FACILITY:  [facility]  PHYSICIAN:  Oretha Milch, MD      DATE OF BIRTH:  1965/04/14  DATE OF STUDY:  08/07/2010                           NOCTURNAL POLYSOMNOGRAM  REFERRING PHYSICIAN:  RAKESH V. ALVA  INDICATION FOR STUDY:  [name] is a 45 year old gentleman with known obstructive sleep apnea diagnosed 8 years ago.  When his weight was 430 pounds, he was set up with a CPAP of 22 cm.  He has lost 100 pounds since then and hence this CPAP retitration was performed.  At the time of this study, he weighed 340 pounds with a height of 6 feet, BMI of 46, neck size of 16 inches.  EPWORTH SLEEPINESS SCORE:  6.  MEDICATIONS:  Include Synthroid, lisinopril, metformin.  This CPAP titration study was performed with a sleep technologist in attendance.  EEG, EOG, EMG, EKG, and respiratory parameters were recorded.  Sleep stages arousals, limb movement, and respiratory data were scored according to criteria laid out by the American Academy of Sleep Medicine.  SLEEP ARCHITECTURE:  Lights out was at 10:41 p.m., lights on was at 5:13 a.m., total sleep time was 337 minutes with a sleep period of 380 minutes and sleep efficiency of 86%.  Sleep latency was 11 minutes. Latency to REM sleep was 78 minutes.  Awake after sleep onset was 42 minutes.  Sleep stages of the percentage of total sleep time was N1 5%, N2 74%, N3 0%, and REM sleep 21%.  Longest period of REM sleep was around 5 a.m.  Supine sleep accounted for 194 minutes.  Supine REM sleep accounted for 57 minutes.  AROUSAL DATA:  There were total of 90 arousals with an arousal index of 17 events per hour, of these 80 were spontaneous, the rest were associated with respiratory events.  RESPIRATORY DATA:  CPAP was initiated at 5 cm and titrated to a final goal of 17 cm at a level of  14 cm for 78 minutes of sleep including 60 minutes of REM sleep, 5 hypopneas were noted with the lowest saturation of 91% at final level of 17 cm for 72.5 minutes, 31 minutes of REM sleep, 2 central apneas were noted with a lowest desaturation of 95%. This appears to be the optimal level used during the study.  OXYGEN DATA:  Desaturation index was 6 events per hour.  The lowest desaturation was 89% on CPAP.  He spent 0 minute with a saturation less than 88%.  CARDIAC DATA:  The low heart rate was 31 beats per minute.  The high heart rate recorded was an artifact.  No arrhythmias were noted.  DISCUSSION:  Slow wave sleep was not noted.  REM supine sleep was noted. He was desensitized with a full-face Mirage nasal mask, C-Flex of 2 cm was used.  CPAP was titrated to obliteration of snoring and respiratory events.  MOVEMENT-PARASOMNIA:  The limb movement index was 10 events per hour. PLM arousal index was only 0.4 events per hour.  IMPRESSIONS-RECOMMENDATIONS: 1. Severe obstructive sleep apnea with  hypopneas causing sleep     fragmentation,  oxygen desaturation. 2. This was corrected by CPAP of 17 cm with a C-Flex of 2 with a full-     face mask.  He tolerated this level well with few arousals. 3. No evidence of cardiac arrhythmias, limb movements, or behavioral     disturbance during sleep.  RECOMMENDATIONS: 1. The treatment options for this degree of sleep disordered breathing     include further weight loss and CPAP therapy. 2. CPAP should be continued with lowering of pressure to 17 cm with a     full-face mask with C-Flex setting of 2 cm.  Compliance can be     monitored at this level. 3. He should be counseled against driving when sleepy. 4. He should be cautioned against medications with sedative side     effects.     Oretha Milch, MD Electronically Signed    RVA/MEDQ  D:  08/10/2010 12:53:34  T:  08/11/2010 01:37:44  Job:  [id number]

## 2010-08-14 ENCOUNTER — Other Ambulatory Visit: Payer: Self-pay | Admitting: Family Medicine

## 2010-08-17 NOTE — Discharge Summary (Signed)
[name], [name]                          ACCOUNT NO.:  [id number]   MEDICAL RECORD NO.:  [id number]                   PATIENT TYPE:  INP   LOCATION:  3305                                 FACILITY:  [facility]   PHYSICIAN:  Ileana Roup, M.D.               DATE OF BIRTH:  09-Sep-1965   DATE OF ADMISSION:  06/24/2003  DATE OF DISCHARGE:  06/30/2003                                 DISCHARGE SUMMARY   RESIDENT:  [name], M.D.   PRIMARY CARE PHYSICIAN:  Dennison Mascot, M.D. in [place], [place]  [place].   CONSULTATIONS:  Dr. Jesse Sans. Wall, [facility].   DISCHARGE DIAGNOSES:  1. Shortness of breath.  2. Lower extremity edema.  3. Obstructive sleep apnea.  4. Morbid obesity with a weight of 430 pounds.  5. Hypertension.  6. Hypothyroidism.   DISCHARGE MEDICATIONS:  1. Aspirin 325 mg one tab p.o. daily.  2. Lasix 20 mg, one tab p.o. daily.  3. Lisinopril 10 mg, one tab p.o. daily.  4. Synthroid 150 mcg, one tab p.o. daily.  5. Imodium p.r.n. diarrhea.   FOLLOWUP:  1. The patient is scheduled to be followed up at the [facility] on Tuesday, July 05, 2003, at 2 p.m.  At the     time of his followup we will need to assess his fluid status and check O2     saturations.  We have requested a stat basic metabolic panel because he     is on new diuretics, and we will check a CBC to follow up on his elevated     CBC at discharge.  2. He will also make an appointment with his primary care physician, Dr.     Dennison Mascot in [place] some time after that.   PROCEDURE/STUDIES:  1. On June 24, 2003:  Chest plain film with results of cardiomegaly and     airway thickening, along with mild central interstitial prominence     suggesting mild congestive heart failure or bronchitis.  2. On June 25, 2003:  A CT of the chest.  A limited CT scan of the chest     for the evaluation of a pulmonary embolus, due to the patient's  size as     well as diminished contrast in the smaller vessels.  There are no large     pulmonary emboli.  Infiltrate in the medial aspect of the right lower     lobe.  3. On June 27, 2003:  A 2-D echocardiogram, transthoracic with the left     ventricle, aortic valve and mitral valve were all grossly normal.  The     left atrium, right ventricle, pulmonic valve, tricuspid valve and the     right atrium were not well visualized.  There was no pericardial     effusion.  This was a technically difficult study  due to large body     habitus and the left ventricle was grossly normal.  4. On June 29, 2003:  A sleep study performed at [facility] was     positive for obstructive sleep apnea.  His CPAP was titrated to a maximum     pressure of 22, and at that pressure he was free of bradycardic and     apneic events with good O2 saturations.   HISTORY OF PRESENT ILLNESS:  This 45 year old white male with a history of  hypothyroidism presented to the emergency room with a two to three-day-  history of shortness of breath with light exertion.  This acute  decompensation over two days began after about two weeks of increasing  shortness of breath.  He says that he has missed work for the last two days,  secondary to intolerance to even light activity; for example, getting out of  his bed or out of his truck, and he says he has had to sleep sitting up in a  chair for the last few nights.  He described a viral-like illness about one  month ago, with cough, sore throat and cervical lymphadenopathy lasting for  about two weeks.   REVIEW OF SYSTEMS:  Is also positive for a feeling of intermittent numbness,  coldness and a loss of circulation in his left arm and both legs, insomnia,  orthopnea, morning headaches and dizziness when standing up.  He also  mentioned that he had been out of his Synthroid for 1-1/2 to 2 weeks.   FAMILY HISTORY:  Significant for congestive heart failure, diabetes  mellitus  type 2 and hypertension.  His mother is alive.  His father is alive, but has  a history of coronary artery bypass graft surgery, diabetes mellitus type 2  and hypertension.   PHYSICAL EXAMINATION:  VITAL SIGNS:  Pulse 73, blood pressure 151/90,  temperature 98.2 degrees, respirations 20, O2 saturation 95% on room air.  GENERAL:  Shortness of breath with light exertion in the examination room.  NECK:  He had no jugular venous distention.  Assessment of his thyroid was  difficult secondary to obesity.  LUNGS:  Clear to auscultation bilaterally.  No crackles were appreciated.  HEART:  A regular rate and rhythm.  No murmurs, rubs, or gallops.  PULSES:  With 2+ pulses, radial and pedal bilaterally.  EXTREMITIES:  Warm and well-perfused.  Upper and lower extremities were  positive for non-pitting edema at that time.  NEUROLOGIC:  There were no focal abnormalities.  His affect was full and  appropriate.   ADMISSION LABORATORY DATA:  Sodium 138, potassium 4.1, chloride 107,  bicarbonate 26.5, BUN 28, creatinine 1.4, glucose 109.  CBC:  WBC 12.6 with  an ANC of 6.8, hemoglobin 13.3, hematocrit 39.0, platelets 224.  I-STAT:  Arterial blood gas:  A pH of 7.37, pCO2 of 46.2, bicarbonate 26.5.  Cardiac  enzymes:  CK-MB 2.1, troponin I less than 0.05, myoglobin 105.  Beta  natriuretic peptide 144.8.  His electrocardiogram was read as demonstrating an incomplete right bundle  branch block, but a sinus rhythm.   HOSPITAL COURSE:  #1 - SHORTNESS OF BREATH:  The main concern for this  patient was pulmonary hypertension with right heart failure, given his acute  onset of shortness of breath over two to three days prior to admission,  accompanied by lower extremity edema.  Pulmonary embolus was ruled out in  the context of slightly elevated D-dimers by chest CT.  A  chest CT did show a right lower lobe infiltrate which was treated empirically with Tequin  until his discharge.  His O2  saturations in the hospital were consistently  in the upper 90s on room air.  His beta natriuretic peptide was 144.8, and  was rechecked at 83.6 on the day of discharge.  His shortness of breath with  light exertion remained essentially the same throughout his hospitalization,  with perhaps some improvement, but it is difficult for the patient to  assess, due to his lack of activity in the hospital.  A 2-D echocardiogram  showed absence of left ventricular dysfunction, but his right ventricular  function was unable to be determined, due to limitations of the study.  Cardiology was consulted, and his shortness of breath was felt to be  secondary to fatigue, related to obstructive sleep apnea, obesity, with a  possible contribution from weakness secondary to hypothyroidism.  The most  important treatments for his presentation of shortness of breath were felt  to be weight loss and treatment of his obstructive sleep apnea, as well as  correcting his hypothyroidism.  #2 - LOWER EXTREMITY EDEMA:  Again, this presentation was not thought to be  due to left heart failure, given his normal left ventricular function by a 2-  D echocardiogram.  There may have been some element of right heart strain,  secondary to obstructive sleep apnea.  He was diuresed with 40 mg of Lasix  daily.  His urine output was not consistently assessed, due to his frequent  movement from floor to floor, but he did have one 24-hour urine collection,  ending on June 29, 2003, that was 2.1 L.  He did have some decrease in his  lower extremity edema, and his edema in his hands on 40 mg of Lasix daily.  We will continue 20 mg of Lasix p.o. daily and reassess in followup.  #3 - HYPOTHYROIDISM:  His free T4 was found to be 0.58, with a TSH of 63.8.  He had been out of his Synthroid in the two weeks before admission, and he  said that he frequently went a day or two without taking his Synthroid prior  to admission.  In addition,  the importance of taking his thyroid hormone  replacement was discussed with the patient.  Follow the TSH on an outpatient  basis.  #4 - OBSTRUCTIVE SLEEP APNEA:  The patient provided a history consistent  with sleep apnea with daytime fatigue, insomnia, morning headaches and  waking up gasping for air with apneic periods, per his wife.  He exhibited  multiple apneic and bradycardic periods of the floor, during which time the  nursing staff entered his room and woke him up.  He was provided a CPAP  machine on the step-down unit for two nights of his stay, with a setting of  10 cmH2O, but he continued to have periods of apnea and bradycardia even at  these settings.  The appropriate setting for his CPAP was determined to be  22 during his sleep study.  On discharge, delivery of a CPAP machine was  arranged to his house.  #5 - HYPERTENSION:  The patient's blood pressure was mostly in the 140s to 150s over 80s to high 90s.  He was started on Lisinopril 10 mg on June 29, 2003.  He was discharged with that medication.  We will reassess his blood  pressure control in followup.   DISCHARGE LABORATORY DATA:  Sodium 136, potassium 4.3, chloride  104,  bicarbonate 27, BUN 15, creatinine 1.2, glucose 87, calcium 8.5.  Beta  natriuretic peptide 83.6.  White blood cells 12, hemoglobin 12.5, hematocrit  36.5, platelets 271.      [name], M.D.                      Ileana Roup, M.D.    LC/MEDQ  D:  06/30/2003  T:  07/02/2003  Job:  [id number]   cc:   Dennison Mascot, M.D.  [place], [place]

## 2010-08-20 NOTE — Telephone Encounter (Signed)
Called pt phone rang multiple times and unable to leave a message.

## 2010-08-21 ENCOUNTER — Telehealth: Payer: Self-pay | Admitting: Pulmonary Disease

## 2010-08-21 NOTE — Telephone Encounter (Signed)
Error.[name] ° ° °

## 2010-08-23 ENCOUNTER — Ambulatory Visit (INDEPENDENT_AMBULATORY_CARE_PROVIDER_SITE_OTHER): Payer: BC Managed Care – PPO | Admitting: Pulmonary Disease

## 2010-08-23 ENCOUNTER — Encounter: Payer: Self-pay | Admitting: Pulmonary Disease

## 2010-08-23 VITALS — BP 118/70 | HR 54 | Temp 98.1°F | Ht 72.0 in | Wt 339.0 lb

## 2010-08-23 DIAGNOSIS — G4730 Sleep apnea, unspecified: Secondary | ICD-10-CM

## 2010-08-23 NOTE — Progress Notes (Signed)
  Subjective:    Patient ID: [name], male    DOB: 29-May-1965, 45 y.o.   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, denies excessive daytime somnolence  Sleeps on his back, 2 pillows, bedtime 11 pm, minimal latency, 1-2 awakenings, no nocturia, oob 5.30 a, no headaches , compliant with cpap  Mask ok, pressure ok, machine is old   08/23/2010 sleep study showed CPAP requirement of 17 cm , with C-flex 2 cm Order sent to [facility] but changes have not been made yet - he is hoping to get a new machine There is no history suggestive of cataplexy, sleep paralysis or parasomnias     Review of Systems Pt denies any significant  nasal congestion or excess secretions, fever, chills, sweats, unintended wt loss, pleuritic or exertional cp, orthopnea pnd or leg swelling.  Pt also denies any obvious fluctuation in symptoms with weather or environmental change or other alleviating or aggravating factors.    Pt denies any increase in rescue therapy over baseline, denies waking up needing it or having early am exacerbations or coughing/wheezing/ or dyspnea       Objective:   Physical Exam Gen. Pleasant, well-nourished, in no distress ENT - no lesions, no post nasal drip, class 2 airway Neck: No JVD, no thyromegaly, no carotid bruits Lungs: no use of accessory muscles, no dullness to percussion, clear without rales or rhonchi  Cardiovascular: Rhythm regular, heart sounds  normal, no murmurs or gallops, no peripheral edema Musculoskeletal: No deformities, no cyanosis or clubbing         Assessment & Plan:

## 2010-08-23 NOTE — Patient Instructions (Signed)
Send in card in 1 month for download

## 2010-08-23 NOTE — Telephone Encounter (Signed)
I informed pt of RA's findings and recommendations. Pt verbalized understanding  

## 2010-08-24 NOTE — Assessment & Plan Note (Signed)
Severe obstructive sleep apnea corrected by CPAP 17 cm (wt 340 lbs) Change to 17 cm, download in 1 month Rx sent for new machine & supplies Weight loss encouraged, compliance with goal of at least 4-6 hrs every night is the expectation. Advised against medications with sedative side effects Cautioned against driving when sleepy - understanding that sleepiness will vary on a day to day basis

## 2010-09-10 ENCOUNTER — Encounter: Payer: Self-pay | Admitting: Pulmonary Disease

## 2010-10-06 ENCOUNTER — Other Ambulatory Visit: Payer: Self-pay | Admitting: Family Medicine

## 2010-10-25 ENCOUNTER — Other Ambulatory Visit: Payer: Self-pay | Admitting: Family Medicine

## 2010-12-10 ENCOUNTER — Other Ambulatory Visit: Payer: BC Managed Care – PPO

## 2010-12-14 ENCOUNTER — Ambulatory Visit: Payer: BC Managed Care – PPO | Admitting: Family Medicine

## 2011-02-25 ENCOUNTER — Other Ambulatory Visit: Payer: Self-pay | Admitting: Family Medicine

## 2011-03-11 ENCOUNTER — Other Ambulatory Visit: Payer: Self-pay | Admitting: Family Medicine

## 2011-04-09 ENCOUNTER — Other Ambulatory Visit: Payer: Self-pay | Admitting: Family Medicine

## 2011-04-22 ENCOUNTER — Other Ambulatory Visit: Payer: Self-pay | Admitting: Family Medicine

## 2011-05-10 ENCOUNTER — Other Ambulatory Visit: Payer: Self-pay | Admitting: Family Medicine

## 2011-05-21 ENCOUNTER — Other Ambulatory Visit: Payer: Self-pay | Admitting: Family Medicine

## 2011-07-14 ENCOUNTER — Other Ambulatory Visit: Payer: Self-pay | Admitting: Family Medicine

## 2011-07-20 ENCOUNTER — Other Ambulatory Visit: Payer: Self-pay | Admitting: Family Medicine

## 2011-08-11 ENCOUNTER — Other Ambulatory Visit: Payer: Self-pay | Admitting: Family Medicine

## 2011-09-08 ENCOUNTER — Other Ambulatory Visit: Payer: Self-pay | Admitting: Family Medicine

## 2011-10-03 LAB — HM DIABETES EYE EXAM

## 2011-10-16 ENCOUNTER — Other Ambulatory Visit: Payer: Self-pay | Admitting: Family Medicine

## 2011-10-16 ENCOUNTER — Encounter: Payer: Self-pay | Admitting: Family Medicine

## 2011-10-16 ENCOUNTER — Ambulatory Visit (INDEPENDENT_AMBULATORY_CARE_PROVIDER_SITE_OTHER): Payer: BC Managed Care – PPO | Admitting: Family Medicine

## 2011-10-16 VITALS — BP 150/84 | HR 61 | Temp 98.4°F | Ht 72.0 in | Wt 388.0 lb

## 2011-10-16 DIAGNOSIS — E119 Type 2 diabetes mellitus without complications: Secondary | ICD-10-CM

## 2011-10-16 DIAGNOSIS — E785 Hyperlipidemia, unspecified: Secondary | ICD-10-CM

## 2011-10-16 DIAGNOSIS — E039 Hypothyroidism, unspecified: Secondary | ICD-10-CM

## 2011-10-16 DIAGNOSIS — I10 Essential (primary) hypertension: Secondary | ICD-10-CM

## 2011-10-16 MED ORDER — LISINOPRIL-HYDROCHLOROTHIAZIDE 10-12.5 MG PO TABS: 1.0000 | ORAL_TABLET | Freq: Every day | ORAL | Status: DC

## 2011-10-16 MED ORDER — SIMVASTATIN 40 MG PO TABS: 40.0000 mg | ORAL_TABLET | Freq: Every day | ORAL | Status: DC

## 2011-10-16 MED ORDER — METFORMIN HCL 1000 MG PO TABS: 1000.0000 mg | ORAL_TABLET | Freq: Two times a day (BID) | ORAL | Status: DC

## 2011-10-16 MED ORDER — LEVOTHYROXINE SODIUM 200 MCG PO TABS: 200.0000 ug | ORAL_TABLET | Freq: Every day | ORAL | Status: DC

## 2011-10-16 MED ORDER — LEVOTHYROXINE SODIUM 50 MCG PO TABS: 50.0000 ug | ORAL_TABLET | Freq: Every day | ORAL | Status: DC

## 2011-10-16 NOTE — Assessment & Plan Note (Signed)
Due for re-eval. Likely poor control off med. 

## 2011-10-16 NOTE — Progress Notes (Signed)
  Subjective:    Patient ID: [name], male    DOB: [birth year], 46 y.o.   MRN: [id number]  HPI Has not been taking any medications except metformin in last 3 month, ran out of refills, was busy couldn't get to MD.  Diabetes:  Due for re-eval.  Has been taking metformin Lab Results  Component Value Date   HGBA1C 6.6* 06/04/2010  Using medications without difficulties: Hypoglycemic episodes:? Hyperglycemic episodes:? Feet problems:None Blood Sugars averaging: not checking. eye exam within last year: None  Hypertension:  Poor control off medication. Using medication without problems or lightheadedness:  None Chest pain with exertion: None Edema:None Short of breath:None Average home BPs:Not checking  Other issues:  Wt Readings from Last 3 Encounters:  10/16/11 388 lb (175.996 kg)  08/23/10 339 lb (153.769 kg)  07/05/10 348 lb (157.852 kg)  Has gained 40 lbs in last year.   Elevated Cholesterol:  Due for re-eval. Lab Results  Component Value Date   CHOL 144 06/04/2010   HDL 35.90* 06/04/2010   LDLCALC 97 06/04/2010   LDLDIRECT 150.4 05/19/2007   TRIG 55.0 06/04/2010   CHOLHDL 4 06/04/2010   Using medications without problems: None Muscle aches: None Diet compliance: Poor Exercise: None Other complaints:  Hypothyroid: Due for re-eval. Lab Results  Component Value Date   TSH 7.41* 06/04/2010     Review of Systems  Constitutional: Negative for fever and fatigue.  HENT: Negative for ear pain.   Eyes: Negative for pain.  Respiratory: Negative for shortness of breath.   Cardiovascular: Negative for chest pain.  Gastrointestinal: Negative for abdominal pain.       Objective:   Physical Exam  Constitutional: Vital signs are normal. He appears well-developed and well-nourished.  HENT:  Head: Normocephalic.  Right Ear: Hearing normal.  Left Ear: Hearing normal.  Nose: Nose normal.  Mouth/Throat: Oropharynx is clear and moist and mucous membranes are normal.  Neck:  Trachea normal. Carotid bruit is not present. No mass and no thyromegaly present.  Cardiovascular: Normal rate, regular rhythm and normal pulses.  Exam reveals no gallop, no distant heart sounds and no friction rub.   No murmur heard.      No peripheral edema  Pulmonary/Chest: Effort normal and breath sounds normal. No respiratory distress.  Skin: Skin is warm, dry and intact. No rash noted.  Psychiatric: He has a normal mood and affect. His speech is normal and behavior is normal. Thought content normal.          Assessment & Plan:

## 2011-10-16 NOTE — Assessment & Plan Note (Signed)
Poor control.Marland Kitchen Restart medicaiton. Follow Bps at home.

## 2011-10-16 NOTE — Assessment & Plan Note (Signed)
Previous well controlled... Continue current med. Check A1C in 3 months. Encouraged exercise, weight loss, healthy eating habits.

## 2011-10-16 NOTE — Assessment & Plan Note (Addendum)
Due for re-eval. Likely poor control off med. 

## 2011-10-16 NOTE — Patient Instructions (Signed)
Restart all medicaitons. Call if not feeling better with energy back on thyroid medication. Follow BP at home to make sure less than 130/80.  Get back on track with diet change, exercise and weight  Loss.

## 2012-01-16 ENCOUNTER — Other Ambulatory Visit (INDEPENDENT_AMBULATORY_CARE_PROVIDER_SITE_OTHER): Payer: BC Managed Care – PPO

## 2012-01-16 DIAGNOSIS — E039 Hypothyroidism, unspecified: Secondary | ICD-10-CM

## 2012-01-23 ENCOUNTER — Encounter: Payer: BC Managed Care – PPO | Admitting: Family Medicine

## 2012-05-05 ENCOUNTER — Ambulatory Visit (INDEPENDENT_AMBULATORY_CARE_PROVIDER_SITE_OTHER): Payer: BC Managed Care – PPO | Admitting: Family Medicine

## 2012-05-05 ENCOUNTER — Encounter: Payer: Self-pay | Admitting: Family Medicine

## 2012-05-05 VITALS — BP 120/88 | HR 76 | Temp 98.2°F | Ht 72.0 in | Wt >= 6400 oz

## 2012-05-05 DIAGNOSIS — I10 Essential (primary) hypertension: Secondary | ICD-10-CM

## 2012-05-05 DIAGNOSIS — E785 Hyperlipidemia, unspecified: Secondary | ICD-10-CM

## 2012-05-05 DIAGNOSIS — E039 Hypothyroidism, unspecified: Secondary | ICD-10-CM

## 2012-05-05 DIAGNOSIS — E119 Type 2 diabetes mellitus without complications: Secondary | ICD-10-CM

## 2012-05-05 LAB — HM DIABETES FOOT EXAM

## 2012-05-05 NOTE — Assessment & Plan Note (Signed)
Due for re-eval. 

## 2012-05-05 NOTE — Assessment & Plan Note (Signed)
At goal.  

## 2012-05-05 NOTE — Patient Instructions (Addendum)
Return for fasting labs in next week. Get back on track with diet and exercise 3-5 times a week. Follow up in 6 months with fasting labs prior.

## 2012-05-05 NOTE — Assessment & Plan Note (Signed)
Stable control at last check. 

## 2012-05-05 NOTE — Progress Notes (Signed)
Subjective:    Patient ID: [name], male    DOB: 1966/02/21, 47 y.o.   MRN: [id number]  HPI  The patient is here for annual wellness exam and preventative care.    Hypertension:   Well controlled lisinopril, HCTZ at home, slightly elevated today Using medication without problems or lightheadedness: None Chest pain with exertion: none Edema:None Short of breath:None Average home BPs: 109/73 a work Other issues:  Elevated Cholesterol: Due for re-eval.  On simvastatin Lab Results  Component Value Date   CHOL 144 06/04/2010   HDL 35.90* 06/04/2010   LDLCALC 97 06/04/2010   LDLDIRECT 150.4 05/19/2007   TRIG 55.0 06/04/2010   CHOLHDL 4 06/04/2010   Using medications without problems:None Muscle aches: None Diet compliance: Poor.. Has gained a lot of weight back. Exercise: None Plans to join the [employer] Other complaints:  Wt Readings from Last 3 Encounters:  05/05/12 411 lb 12 oz (186.769 kg)  10/16/11 388 lb (175.996 kg)  08/23/10 339 lb (153.769 kg)     Diabetes: Due for re-eval. On metformin.  Lab Results  Component Value Date   HGBA1C 6.6* 06/04/2010    Using medications without difficulties:None Hypoglycemic episodes:? Hyperglycemic episodes:? Feet problems: No ulcer Blood Sugars averaging: not checking eye exam within last year:yes  Hypothyroid:  Free t4 nml 10/13. Lab Results  Component Value Date   TSH 6.57* 01/16/2012     Review of Systems  Constitutional: Negative for fever, fatigue and unexpected weight change.  HENT: Negative for ear pain, congestion, sore throat, rhinorrhea, trouble swallowing and postnasal drip.   Eyes: Negative for pain.  Respiratory: Negative for cough, shortness of breath and wheezing.   Cardiovascular: Negative for chest pain, palpitations and leg swelling.  Gastrointestinal: Negative for nausea, abdominal pain, diarrhea, constipation and blood in stool.  Genitourinary: Negative for dysuria, urgency, hematuria, discharge, penile  swelling, scrotal swelling, difficulty urinating, penile pain and testicular pain.  Skin: Negative for rash.  Neurological: Negative for syncope, weakness, light-headedness, numbness and headaches.  Psychiatric/Behavioral: Negative for behavioral problems and dysphoric mood. The patient is not nervous/anxious.        Objective:   Physical Exam  Constitutional: He appears well-developed and well-nourished.  Non-toxic appearance. He does not appear ill. No distress.  HENT:  Head: Normocephalic and atraumatic.  Right Ear: Hearing, tympanic membrane, external ear and ear canal normal.  Left Ear: Hearing, tympanic membrane, external ear and ear canal normal.  Nose: Nose normal.  Mouth/Throat: Uvula is midline, oropharynx is clear and moist and mucous membranes are normal.  Eyes: Conjunctivae normal, EOM and lids are normal. Pupils are equal, round, and reactive to light. No foreign bodies found.  Neck: Trachea normal, normal range of motion and phonation normal. Neck supple. Carotid bruit is not present. No mass and no thyromegaly present.  Cardiovascular: Normal rate, regular rhythm, S1 normal, S2 normal, intact distal pulses and normal pulses.  Exam reveals no gallop.   No murmur heard. Pulmonary/Chest: Breath sounds normal. He has no wheezes. He has no rhonchi. He has no rales.  Abdominal: Soft. Normal appearance and bowel sounds are normal. There is no hepatosplenomegaly. There is no tenderness. There is no rebound, no guarding and no CVA tenderness. No hernia. Hernia confirmed negative in the right inguinal area and confirmed negative in the left inguinal area.  Genitourinary: Testes normal and penis normal. Right testis shows no mass and no tenderness. Left testis shows no mass and no tenderness. No paraphimosis or penile tenderness.  Lymphadenopathy:    He has no cervical adenopathy.       Right: No inguinal adenopathy present.       Left: No inguinal adenopathy present.  Neurological:  He is alert. He has normal strength and normal reflexes. No cranial nerve deficit or sensory deficit. Gait normal.  Skin: Skin is warm, dry and intact. No rash noted.  Psychiatric: He has a normal mood and affect. His speech is normal and behavior is normal. Judgment normal.          Assessment & Plan:  The patient's preventative maintenance and recommended screening tests for an annual wellness exam were reviewed in full today. Brought up to date unless services declined.  Counselled on the importance of diet, exercise, and its role in overall health and mortality. The patient's FH and SH was reviewed, including their home life, tobacco status, and drug and alcohol status.   No family history of early prostate cancer or colon cancer  Vaccines: uptodate with Td.  Nonsmoker. Defers STD testing.

## 2012-05-06 ENCOUNTER — Other Ambulatory Visit: Payer: BC Managed Care – PPO

## 2012-05-07 ENCOUNTER — Other Ambulatory Visit: Payer: Self-pay | Admitting: Family Medicine

## 2012-05-07 ENCOUNTER — Telehealth: Payer: Self-pay | Admitting: Family Medicine

## 2012-05-07 DIAGNOSIS — E7800 Pure hypercholesterolemia, unspecified: Secondary | ICD-10-CM

## 2012-05-07 DIAGNOSIS — E119 Type 2 diabetes mellitus without complications: Secondary | ICD-10-CM

## 2012-05-07 DIAGNOSIS — I10 Essential (primary) hypertension: Secondary | ICD-10-CM

## 2012-05-07 NOTE — Telephone Encounter (Signed)
I did not reenter labs because the labs he missed in 12/2011 are what we need now... A1c, CMET and lipids.

## 2012-05-07 NOTE — Telephone Encounter (Signed)
Message copied by Excell Seltzer on Thu May 07, 2012 12:46 AM ------      Message from: Alvina Chou      Created: Tue May 05, 2012  4:21 PM      Regarding: Lab orders for Wednesday, 2.5.14       Fasting labs, Thanks, Camelia Eng

## 2012-10-19 ENCOUNTER — Other Ambulatory Visit: Payer: Self-pay | Admitting: Family Medicine

## 2012-10-27 ENCOUNTER — Other Ambulatory Visit (INDEPENDENT_AMBULATORY_CARE_PROVIDER_SITE_OTHER): Payer: BC Managed Care – PPO

## 2012-10-27 DIAGNOSIS — E119 Type 2 diabetes mellitus without complications: Secondary | ICD-10-CM

## 2012-10-27 DIAGNOSIS — I10 Essential (primary) hypertension: Secondary | ICD-10-CM

## 2012-10-27 DIAGNOSIS — E7800 Pure hypercholesterolemia, unspecified: Secondary | ICD-10-CM

## 2012-10-27 LAB — LIPID PANEL
Cholesterol: 131 mg/dL (ref 0–200)
HDL: 32.1 mg/dL — ABNORMAL LOW (ref >39.00)
VLDL: 20.4 mg/dL (ref 0.0–40.0)

## 2012-10-27 LAB — COMPREHENSIVE METABOLIC PANEL
ALT: 21 U/L (ref 0–53)
CO2: 27 mEq/L (ref 19–32)
Calcium: 9 mg/dL (ref 8.4–10.5)
Chloride: 106 mEq/L (ref 96–112)
Creatinine, Ser: 0.9 mg/dL (ref 0.4–1.5)
GFR: 103.59 mL/min (ref >60.00)
Glucose, Bld: 124 mg/dL — ABNORMAL HIGH (ref 70–99)
Sodium: 139 mEq/L (ref 135–145)
Total Bilirubin: 0.4 mg/dL (ref 0.3–1.2)
Total Protein: 6.9 g/dL (ref 6.0–8.3)

## 2012-10-27 LAB — HEMOGLOBIN A1C: Hgb A1c MFr Bld: 6.9 % — ABNORMAL HIGH (ref 4.6–6.5)

## 2012-10-28 ENCOUNTER — Encounter: Payer: Self-pay | Admitting: Family Medicine

## 2012-10-28 ENCOUNTER — Telehealth: Payer: Self-pay | Admitting: Family Medicine

## 2012-10-28 NOTE — Telephone Encounter (Signed)
Pt left vm requesting MyChart link and activation code.  Attempted to call pt via phone number left in vm and home phone listed in chart without success.  Letter with MyChart activation code mailed to pt.

## 2012-10-30 ENCOUNTER — Other Ambulatory Visit: Payer: Self-pay | Admitting: Family Medicine

## 2012-11-03 ENCOUNTER — Encounter: Payer: Self-pay | Admitting: Family Medicine

## 2012-11-03 ENCOUNTER — Ambulatory Visit (INDEPENDENT_AMBULATORY_CARE_PROVIDER_SITE_OTHER): Payer: BC Managed Care – PPO | Admitting: Family Medicine

## 2012-11-03 VITALS — BP 110/60 | HR 65 | Temp 97.7°F | Wt 382.0 lb

## 2012-11-03 DIAGNOSIS — I10 Essential (primary) hypertension: Secondary | ICD-10-CM

## 2012-11-03 DIAGNOSIS — E119 Type 2 diabetes mellitus without complications: Secondary | ICD-10-CM

## 2012-11-03 DIAGNOSIS — E039 Hypothyroidism, unspecified: Secondary | ICD-10-CM

## 2012-11-03 DIAGNOSIS — E785 Hyperlipidemia, unspecified: Secondary | ICD-10-CM

## 2012-11-03 DIAGNOSIS — Z125 Encounter for screening for malignant neoplasm of prostate: Secondary | ICD-10-CM

## 2012-11-03 MED ORDER — SIMVASTATIN 40 MG PO TABS: 40.0000 mg | ORAL_TABLET | Freq: Every day | ORAL | Status: DC

## 2012-11-03 MED ORDER — LEVOTHYROXINE SODIUM 200 MCG PO TABS: 200.0000 ug | ORAL_TABLET | Freq: Every day | ORAL | Status: DC

## 2012-11-03 MED ORDER — LEVOTHYROXINE SODIUM 50 MCG PO TABS: 50.0000 ug | ORAL_TABLET | Freq: Every day | ORAL | Status: DC

## 2012-11-03 MED ORDER — LISINOPRIL-HYDROCHLOROTHIAZIDE 10-12.5 MG PO TABS: 1.0000 | ORAL_TABLET | Freq: Every day | ORAL | Status: DC

## 2012-11-03 MED ORDER — METFORMIN HCL 1000 MG PO TABS: 1000.0000 mg | ORAL_TABLET | Freq: Two times a day (BID) | ORAL | Status: DC

## 2012-11-03 NOTE — Patient Instructions (Addendum)
   Keep up the great weight loss! Get to the gym to increase regular exercise. Keep working on [employer].. Focus on low carb diet. Follow up in 6 months for CPX with labs prior.

## 2012-11-03 NOTE — Assessment & Plan Note (Signed)
Slight worsening of control despite weight loss. Follow up in 6 months. Keep working on lifestyle changes.

## 2012-11-03 NOTE — Assessment & Plan Note (Signed)
Well controlled. Continue current medication.  

## 2012-11-03 NOTE — Progress Notes (Signed)
47 year old male presents for 6 month follow up.  Hypertension: Well controlled lisinopril, HCTZ  Using medication without problems or lightheadedness: None  Chest pain with exertion: none  Edema:None  Short of breath:None  Average home BPs: 110/70s at home  Other issues:   Elevated Cholesterol:LDL now at goal <100 on simvastatin  Lab Results  Component Value Date   CHOL 131 10/27/2012   HDL 32.10* 10/27/2012   LDLCALC 79 10/27/2012   LDLDIRECT 150.4 05/19/2007   TRIG 102.0 10/27/2012   CHOLHDL 4 10/27/2012   Using medications without problems:None  Muscle aches: None  Diet compliance: Improved.  Cut out gluten , soft drinks. Trying to decrease carbs as well. Exercise:  Occ walking but not regualr Other complaints:  Has had significant weight loss  Wt Readings from Last 3 Encounters:  11/03/12 382 lb (173.274 kg)  05/05/12 411 lb 12 oz (186.769 kg)  10/16/11 388 lb (175.996 kg)   Diabetes: Slight worsening of control since last OV. Lab Results  Component Value Date   HGBA1C 6.9* 10/27/2012   Using medications without difficulties:None  Hypoglycemic episodes:?  Hyperglycemic episodes:?  Feet problems: No ulcer  Blood Sugars averaging: not checking  eye exam within last year:No  Review of Systems  Constitutional: Negative for fever, fatigue and unexpected weight change.  HENT: Negative for ear pain, congestion, sore throat, rhinorrhea, trouble swallowing and postnasal drip.  Eyes: Negative for pain.  Respiratory: Negative for cough, shortness of breath and wheezing.  Cardiovascular: Negative for chest pain, palpitations and leg swelling.  Gastrointestinal: Negative for nausea, abdominal pain, diarrhea, constipation and blood in stool.  Genitourinary: Negative for dysuria, urgency, hematuria, discharge, penile swelling, scrotal swelling, difficulty urinating, penile pain and testicular pain.  Skin: Negative for rash.  Neurological: Negative for syncope, weakness,  light-headedness, numbness and headaches.  Psychiatric/Behavioral: Negative for behavioral problems and dysphoric mood. The patient is not nervous/anxious.  Objective:   Physical Exam  Constitutional: He appears well-developed and well-nourished. Non-toxic appearance. He does not appear ill. No distress.  HENT:  Head: Normocephalic and atraumatic.  Right Ear: Hearing, tympanic membrane, external ear and ear canal normal.  Left Ear: Hearing, tympanic membrane, external ear and ear canal normal.  Nose: Nose normal.  Mouth/Throat: Uvula is midline, oropharynx is clear and moist and mucous membranes are normal.  Eyes: Conjunctivae normal, EOM and lids are normal. Pupils are equal, round, and reactive to light. No foreign bodies found.  Neck: Trachea normal, normal range of motion and phonation normal. Neck supple. Carotid bruit is not present. No mass and no thyromegaly present.  Cardiovascular: Normal rate, regular rhythm, S1 normal, S2 normal, intact distal pulses and normal pulses. Exam reveals no gallop.  No murmur heard.  Pulmonary/Chest: Breath sounds normal. He has no wheezes. He has no rhonchi. He has no rales.  Abdominal: Soft. Normal appearance and bowel sounds are normal. There is no hepatosplenomegaly. There is no tenderness. There is no rebound, no guarding and no CVA tenderness. No hernia. Hernia confirmed negative in the right inguinal area and confirmed negative in the left inguinal area.  Genitourinary: Testes normal and penis normal. Right testis shows no mass and no tenderness. Left testis shows no mass and no tenderness. No paraphimosis or penile tenderness.  Lymphadenopathy:  He has no cervical adenopathy.  Right: No inguinal adenopathy present.  Left: No inguinal adenopathy present.  Neurological: He is alert. He has normal strength and normal reflexes. No cranial nerve deficit or sensory deficit. Gait  normal.  Skin: Skin is warm, dry and intact. No rash noted.  Psychiatric:  He has a normal mood and affect. His speech is normal and behavior is normal. Judgment normal.    Diabetic foot exam: Normal inspection No skin breakdown No calluses  Normal DP pulses Normal sensation to light touch and monofilament Nails normal

## 2012-11-03 NOTE — Assessment & Plan Note (Signed)
At goal. Encouraged exercise, weight loss, healthy eating habits.  

## 2012-11-14 ENCOUNTER — Other Ambulatory Visit: Payer: Self-pay | Admitting: Family Medicine

## 2013-02-04 ENCOUNTER — Other Ambulatory Visit: Payer: Self-pay

## 2013-03-04 ENCOUNTER — Other Ambulatory Visit: Payer: Self-pay | Admitting: Family Medicine

## 2013-04-06 ENCOUNTER — Other Ambulatory Visit: Payer: Self-pay | Admitting: Family Medicine

## 2013-05-01 ENCOUNTER — Other Ambulatory Visit: Payer: Self-pay | Admitting: Family Medicine

## 2013-05-12 LAB — HM DIABETES EYE EXAM

## 2013-05-20 LAB — HM DIABETES EYE EXAM

## 2013-11-09 ENCOUNTER — Encounter: Payer: Self-pay | Admitting: Family Medicine

## 2013-11-09 ENCOUNTER — Ambulatory Visit (INDEPENDENT_AMBULATORY_CARE_PROVIDER_SITE_OTHER): Payer: BC Managed Care – PPO | Admitting: Family Medicine

## 2013-11-09 VITALS — BP 136/90 | HR 78 | Temp 98.1°F | Ht 71.61 in | Wt >= 6400 oz

## 2013-11-09 DIAGNOSIS — N529 Male erectile dysfunction, unspecified: Secondary | ICD-10-CM

## 2013-11-09 DIAGNOSIS — I10 Essential (primary) hypertension: Secondary | ICD-10-CM

## 2013-11-09 DIAGNOSIS — E119 Type 2 diabetes mellitus without complications: Secondary | ICD-10-CM

## 2013-11-09 DIAGNOSIS — E785 Hyperlipidemia, unspecified: Secondary | ICD-10-CM

## 2013-11-09 DIAGNOSIS — E039 Hypothyroidism, unspecified: Secondary | ICD-10-CM

## 2013-11-09 DIAGNOSIS — N528 Other male erectile dysfunction: Secondary | ICD-10-CM

## 2013-11-09 LAB — HM DIABETES FOOT EXAM

## 2013-11-09 MED ORDER — TADALAFIL 20 MG PO TABS: 20.0000 mg | ORAL_TABLET | Freq: Every day | ORAL | Status: DC | PRN

## 2013-11-09 NOTE — Assessment & Plan Note (Signed)
In adequate control on no med. Restart.

## 2013-11-09 NOTE — Patient Instructions (Addendum)
Schedule fasting labs in next  Week. Stop by front desk to speak with Shirlee LimerickMarion to get information on bariatric seminar at [facility]. Get back on track with exercise, weight loss and healthy eating. Avoid fast food.

## 2013-11-09 NOTE — Progress Notes (Signed)
48 year old male presents for med refill, over due for CPX, last seen in 10/2012.  Hypertension: BP elevated today, previously controlled on lisinopril, HCTZ  He has not taken his med in last 4 days. BP Readings from Last 3 Encounters:  11/09/13 136/90  11/03/12 110/60  05/05/12 120/88  Using medication without problems or lightheadedness: None  Chest pain with exertion: none  Edema:None  Short of breath:None  Average home BPs: since weight gain 120/80 Other issues:   Elevated Cholesterol: Due for re-eval on simvastatin  Lab Results  Component Value Date   CHOL 131 10/27/2012   HDL 32.10* 10/27/2012   LDLCALC 79 10/27/2012   LDLDIRECT 150.4 05/19/2007   TRIG 102.0 10/27/2012   CHOLHDL 4 10/27/2012  Using medications without problems:None  Muscle aches: None  Diet compliance:Poor Exercise: Occ walking but not regualr  Other complaints:   He has had significant weight gain since last year! 40 lbs.  He feels more tired since gaining weight back.   He is interested in gastric bypass. Wt Readings from Last 3 Encounters:  11/09/13 419 lb 8 oz (190.284 kg)  11/03/12 382 lb (173.274 kg)  05/05/12 411 lb 12 oz (186.769 kg)    Diabetes: Due for re-eval.  Lab Results   Component  Value  Date    HGBA1C  6.9*  10/27/2012   Using medications without difficulties:None  Hypoglycemic episodes:?  Hyperglycemic episodes:?  Feet problems: No ulcer  Blood Sugars averaging: not checking  eye exam within last year: 6 months ago  He has been having issues with erectile dysfunction. Difficulty getting and erection and keeping.  Review of Systems  Constitutional: Negative for fever, fatigue and unexpected weight change.  HENT: Negative for ear pain, congestion, sore throat, rhinorrhea, trouble swallowing and postnasal drip.  Eyes: Negative for pain.  Respiratory: Negative for cough, shortness of breath and wheezing.  Cardiovascular: Negative for chest pain, palpitations and leg swelling.   Gastrointestinal: Negative for nausea, abdominal pain, diarrhea, constipation and blood in stool.  Genitourinary: Negative for dysuria, urgency, hematuria, discharge, penile swelling, scrotal swelling, difficulty urinating, penile pain and testicular pain.  Skin: Negative for rash.  Neurological: Negative for syncope, weakness, light-headedness, numbness and headaches.  Psychiatric/Behavioral: Negative for behavioral problems and dysphoric mood. The patient is not nervous/anxious.  Objective:   Physical Exam  Constitutional: He appears well-developed and well-nourished. Non-toxic appearance. He does not appear ill. No distress.  HENT:  Head: Normocephalic and atraumatic.  Right Ear: Hearing, tympanic membrane, external ear and ear canal normal.  Left Ear: Hearing, tympanic membrane, external ear and ear canal normal.  Nose: Nose normal.  Mouth/Throat: Uvula is midline, oropharynx is clear and moist and mucous membranes are normal.  Eyes: Conjunctivae normal, EOM and lids are normal. Pupils are equal, round, and reactive to light. No foreign bodies found.  Neck: Trachea normal, normal range of motion and phonation normal. Neck supple. Carotid bruit is not present. No mass and no thyromegaly present.  Cardiovascular: Normal rate, regular rhythm, S1 normal, S2 normal, intact distal pulses and normal pulses. Exam reveals no gallop.  No murmur heard.  Pulmonary/Chest: Breath sounds normal. He has no wheezes. He has no rhonchi. He has no rales.  Abdominal: Soft. Normal appearance and bowel sounds are normal. There is no hepatosplenomegaly. There is no tenderness. There is no rebound, no guarding and no CVA tenderness. No hernia. Hernia confirmed negative in the right inguinal area and confirmed negative in the left inguinal area.  Genitourinary: Testes normal and penis normal. Right testis shows no mass and no tenderness. Left testis shows no mass and no tenderness. No paraphimosis or penile  tenderness.  Lymphadenopathy:  He has no cervical adenopathy.  Right: No inguinal adenopathy present.  Left: No inguinal adenopathy present.  Neurological: He is alert. He has normal strength and normal reflexes. No cranial nerve deficit or sensory deficit. Gait normal.  Skin: Skin is warm, dry and intact. No rash noted.  Psychiatric: He has a normal mood and affect. His speech is normal and behavior is normal. Judgment normal.    Diabetic foot exam:  Normal inspection  No skin breakdown  No calluses  Normal DP pulses  Normal sensation to light touch and monofilament  Nails normal

## 2013-11-09 NOTE — Progress Notes (Signed)
Pre visit review using our clinic review tool, if applicable. No additional management support is needed unless otherwise documented below in the visit note. 

## 2013-11-09 NOTE — Assessment & Plan Note (Signed)
Counseled on bariatric surgery options.

## 2013-11-09 NOTE — Assessment & Plan Note (Signed)
Due for re-eval. 

## 2013-11-09 NOTE — Assessment & Plan Note (Signed)
Multifactorial. Lose weight.  Trial of cialis.

## 2013-11-09 NOTE — Assessment & Plan Note (Signed)
Due for re-eval. Encouraged exercise, weight loss, healthy eating habits.  

## 2013-11-11 ENCOUNTER — Telehealth: Payer: Self-pay

## 2013-11-11 MED ORDER — SILDENAFIL CITRATE 100 MG PO TABS: 50.0000 mg | ORAL_TABLET | Freq: Every day | ORAL | Status: DC | PRN

## 2013-11-11 NOTE — Telephone Encounter (Signed)
Pt left v/m pts insurance will not pay for cialis; cost to pt was going to be $500.00 and pt request substitute med that would be less expensive. Pt request cb. [facility].

## 2013-11-11 NOTE — Telephone Encounter (Signed)
Left message for [name] to return my call.

## 2013-11-11 NOTE — Telephone Encounter (Signed)
I will send in viagra, biut pt may want to contact his insurance to see what is preferred, or look on drug websites for coupons for either viagra, cialis or levitra.

## 2013-11-15 NOTE — Telephone Encounter (Signed)
Patient notified as instructed by telephone. 

## 2013-11-18 ENCOUNTER — Other Ambulatory Visit (INDEPENDENT_AMBULATORY_CARE_PROVIDER_SITE_OTHER): Payer: BC Managed Care – PPO

## 2013-11-18 DIAGNOSIS — E785 Hyperlipidemia, unspecified: Secondary | ICD-10-CM

## 2013-11-18 DIAGNOSIS — E039 Hypothyroidism, unspecified: Secondary | ICD-10-CM

## 2013-11-18 DIAGNOSIS — E119 Type 2 diabetes mellitus without complications: Secondary | ICD-10-CM

## 2013-11-18 DIAGNOSIS — I10 Essential (primary) hypertension: Secondary | ICD-10-CM

## 2013-11-18 LAB — COMPREHENSIVE METABOLIC PANEL
ALBUMIN: 3.7 g/dL (ref 3.5–5.2)
ALT: 41 U/L (ref 0–53)
AST: 35 U/L (ref 0–37)
Alkaline Phosphatase: 49 U/L (ref 39–117)
BUN: 13 mg/dL (ref 6–23)
CHLORIDE: 100 meq/L (ref 96–112)
CO2: 28 meq/L (ref 19–32)
CREATININE: 0.8 mg/dL (ref 0.4–1.5)
Calcium: 9.1 mg/dL (ref 8.4–10.5)
GFR: 110.58 mL/min (ref >60.00)
Glucose, Bld: 202 mg/dL — ABNORMAL HIGH (ref 70–99)
Potassium: 4.2 mEq/L (ref 3.5–5.1)
Sodium: 136 mEq/L (ref 135–145)
Total Bilirubin: 0.8 mg/dL (ref 0.2–1.2)
Total Protein: 7.3 g/dL (ref 6.0–8.3)

## 2013-11-18 LAB — LIPID PANEL
Cholesterol: 158 mg/dL (ref 0–200)
HDL: 38.7 mg/dL — AB (ref >39.00)
LDL Cholesterol: 96 mg/dL (ref 0–99)
NONHDL: 119.3
TRIGLYCERIDES: 118 mg/dL (ref 0.0–149.0)
Total CHOL/HDL Ratio: 4
VLDL: 23.6 mg/dL (ref 0.0–40.0)

## 2013-11-18 LAB — HEMOGLOBIN A1C: Hgb A1c MFr Bld: 9.9 % — ABNORMAL HIGH (ref 4.6–6.5)

## 2013-11-18 LAB — TSH: TSH: 3.01 u[IU]/mL (ref 0.35–4.50)

## 2013-11-19 ENCOUNTER — Telehealth: Payer: Self-pay | Admitting: Family Medicine

## 2013-11-19 MED ORDER — GLIPIZIDE ER 10 MG PO TB24: 10.0000 mg | ORAL_TABLET | Freq: Every day | ORAL | Status: DC

## 2013-11-19 NOTE — Telephone Encounter (Signed)
Message copied by Excell SeltzerBEDSOLE, AMY E on Fri Nov 19, 2013  5:07 PM ------      Message from: Damita LackLORING, DONNA S      Created: Fri Nov 19, 2013 12:58 PM       Mr. [name] notified as instructed by telephone.  He is open to adding diabetic medication in addition to the Metformin.  Follow up appointment scheduled 02/22/2014 @ 7:15am with fasting lab appointment 02/18/2014. ------

## 2013-11-19 NOTE — Telephone Encounter (Signed)
Rx for glipizide xl sent in. Make sure pt aware to take this within 30 min of a meal in AM.  Keep follow up and labs as scheduled.

## 2013-11-19 NOTE — Telephone Encounter (Signed)
Patient advised.

## 2013-11-30 ENCOUNTER — Other Ambulatory Visit: Payer: Self-pay | Admitting: Family Medicine

## 2014-01-26 ENCOUNTER — Other Ambulatory Visit: Payer: Self-pay | Admitting: Family Medicine

## 2014-02-17 ENCOUNTER — Telehealth: Payer: Self-pay | Admitting: Family Medicine

## 2014-02-17 DIAGNOSIS — E1165 Type 2 diabetes mellitus with hyperglycemia: Secondary | ICD-10-CM

## 2014-02-17 NOTE — Telephone Encounter (Signed)
-----   Message from Alvina Chouerri J Walsh sent at 02/10/2014  6:32 PM EST ----- Regarding: Lab orders for Friday, 11.20.15 Lab orders for a f/u appt

## 2014-02-18 ENCOUNTER — Other Ambulatory Visit: Payer: BC Managed Care – PPO

## 2014-02-21 ENCOUNTER — Ambulatory Visit: Payer: BC Managed Care – PPO | Admitting: Family Medicine

## 2014-02-21 ENCOUNTER — Other Ambulatory Visit: Payer: Self-pay | Admitting: Family Medicine

## 2014-02-22 ENCOUNTER — Ambulatory Visit: Payer: BC Managed Care – PPO | Admitting: Family Medicine

## 2014-04-08 ENCOUNTER — Other Ambulatory Visit: Payer: BC Managed Care – PPO

## 2014-04-08 ENCOUNTER — Other Ambulatory Visit: Payer: Self-pay | Admitting: Family Medicine

## 2014-04-12 ENCOUNTER — Ambulatory Visit: Payer: BC Managed Care – PPO | Admitting: Family Medicine

## 2014-09-22 ENCOUNTER — Telehealth: Payer: Self-pay

## 2014-09-22 NOTE — Telephone Encounter (Signed)
Diabetic Bundle. Left voicemail advising pt his A1C blood test is due. Pt advised to contact PCP's office to schedule.  

## 2014-09-26 ENCOUNTER — Other Ambulatory Visit: Payer: Self-pay

## 2014-11-12 ENCOUNTER — Other Ambulatory Visit: Payer: Self-pay | Admitting: Family Medicine

## 2014-11-13 NOTE — Telephone Encounter (Signed)
Please call and schedule CPE with fasting labs prior with Dr. Besdole. 

## 2014-11-15 NOTE — Telephone Encounter (Signed)
Left message asking pt to call office  °

## 2014-12-16 ENCOUNTER — Other Ambulatory Visit: Payer: Self-pay | Admitting: Family Medicine

## 2014-12-16 NOTE — Telephone Encounter (Signed)
Left message asking pt to call office  °

## 2014-12-16 NOTE — Telephone Encounter (Signed)
Please call and scheduled CPE with fasting labs prior or at least a diabetic follow up with labs prior for Dr. Ermalene Searing.

## 2014-12-29 ENCOUNTER — Other Ambulatory Visit: Payer: Self-pay | Admitting: Family Medicine

## 2015-02-14 ENCOUNTER — Other Ambulatory Visit: Payer: Self-pay | Admitting: Family Medicine

## 2015-02-14 NOTE — Telephone Encounter (Signed)
Left message asking pt to call office  °

## 2015-02-14 NOTE — Telephone Encounter (Signed)
Please call and schedule CPE with fasting labs prior with Dr. Bedsole. 

## 2015-05-30 ENCOUNTER — Encounter: Payer: Self-pay | Admitting: Family Medicine

## 2015-05-30 ENCOUNTER — Ambulatory Visit (INDEPENDENT_AMBULATORY_CARE_PROVIDER_SITE_OTHER): Payer: BC Managed Care – PPO | Admitting: Family Medicine

## 2015-05-30 VITALS — BP 118/80 | HR 78 | Temp 97.9°F | Ht 71.05 in | Wt >= 6400 oz

## 2015-05-30 DIAGNOSIS — E039 Hypothyroidism, unspecified: Secondary | ICD-10-CM

## 2015-05-30 DIAGNOSIS — I10 Essential (primary) hypertension: Secondary | ICD-10-CM

## 2015-05-30 DIAGNOSIS — Z0000 Encounter for general adult medical examination without abnormal findings: Secondary | ICD-10-CM | POA: Diagnosis not present

## 2015-05-30 DIAGNOSIS — E785 Hyperlipidemia, unspecified: Secondary | ICD-10-CM

## 2015-05-30 DIAGNOSIS — Z23 Encounter for immunization: Secondary | ICD-10-CM

## 2015-05-30 DIAGNOSIS — E1165 Type 2 diabetes mellitus with hyperglycemia: Secondary | ICD-10-CM | POA: Diagnosis not present

## 2015-05-30 LAB — HM DIABETES FOOT EXAM

## 2015-05-30 MED ORDER — SIMVASTATIN 40 MG PO TABS: 40.0000 mg | ORAL_TABLET | Freq: Every day | ORAL | Status: DC

## 2015-05-30 MED ORDER — GLIPIZIDE ER 10 MG PO TB24: 10.0000 mg | ORAL_TABLET | Freq: Every day | ORAL | Status: DC

## 2015-05-30 MED ORDER — METFORMIN HCL 1000 MG PO TABS: ORAL_TABLET | ORAL | Status: DC

## 2015-05-30 MED ORDER — LEVOTHYROXINE SODIUM 50 MCG PO TABS: ORAL_TABLET | ORAL | Status: DC

## 2015-05-30 MED ORDER — LISINOPRIL-HYDROCHLOROTHIAZIDE 10-12.5 MG PO TABS: 1.0000 | ORAL_TABLET | Freq: Every day | ORAL | Status: DC

## 2015-05-30 MED ORDER — LEVOTHYROXINE SODIUM 200 MCG PO TABS: 200.0000 ug | ORAL_TABLET | Freq: Every day | ORAL | Status: DC

## 2015-05-30 NOTE — Assessment & Plan Note (Signed)
Due for re-eval. 

## 2015-05-30 NOTE — Assessment & Plan Note (Signed)
Counseled on lifestyle changes. Reviewed diet plan and how to increase exercise safely.

## 2015-05-30 NOTE — Assessment & Plan Note (Signed)
Well controlled. Continue current medication.  

## 2015-05-30 NOTE — Progress Notes (Signed)
50 year old male presents for med refill, over due for CPX, last seen in 2015.Marland Kitchen  Hypertension: BP well controlled on lisinopril, HCTZ  BP Readings from Last 3 Encounters:  05/30/15 118/80  11/09/13 136/90  11/03/12 110/60  Using medication without problems or lightheadedness: None  Chest pain with exertion: none  Edema:None  Short of breath:None  Average home BPs:115-120/75-80 Other issues:   Elevated Cholesterol: Due for re-eval on simvastatin  Lab Results  Component Value Date   CHOL 158 11/18/2013   HDL 38.70* 11/18/2013   LDLCALC 96 11/18/2013   LDLDIRECT 150.4 05/19/2007   TRIG 118.0 11/18/2013   CHOLHDL 4 11/18/2013  Using medications without problems:None  Muscle aches: None  Diet compliance: low carb diet, has stopped soda in last few weeks. Exercise: Has been walking more in last 3-4 weeks. Other complaints:   Wt Readings from Last 3 Encounters:  05/30/15 409 lb 8 oz (185.748 kg)  11/09/13 419 lb 8 oz (190.284 kg)  11/03/12 382 lb (173.274 kg)   Diabetes: Due for re-eval.  Pt uncompliant with follow ups. He is on glipizide max  And metefomrin 1000 mg twice daily for the diabetes but has been out in last week. Lab Results  Component Value Date   HGBA1C 9.9* 11/18/2013  Using medications without difficulties:None  Hypoglycemic episodes:?  Hyperglycemic episodes:?  Feet problems: No ulcer  Blood Sugars averaging: not checking  eye exam within last year: Due  Hypothyroidism: on levothyroxine, but has been out of med in last 2-3 weeks.  Due for re-eval. Lab Results  Component Value Date   TSH 3.01 11/18/2013    Review of Systems  Constitutional: Negative for fever, fatigue and unexpected weight change.  HENT: Negative for ear pain, congestion, sore throat, rhinorrhea, trouble swallowing and postnasal drip.  Eyes: Negative for pain.  Respiratory: Negative for cough, shortness of breath and wheezing.  Cardiovascular: Negative for chest  pain, palpitations and leg swelling.  Gastrointestinal: Negative for nausea, abdominal pain, diarrhea, constipation and blood in stool.  Genitourinary: Negative for dysuria, urgency, hematuria, discharge, penile swelling, scrotal swelling, difficulty urinating, penile pain and testicular pain.  Skin: Negative for rash.  Neurological: Negative for syncope, weakness, light-headedness, numbness and headaches.  Psychiatric/Behavioral: Negative for behavioral problems and dysphoric mood. The patient is not nervous/anxious.  Objective:   Physical Exam  Constitutional: He appears well-developed and well-nourished. Non-toxic appearance. He does not appear ill. No distress.  HENT:  Head: Normocephalic and atraumatic.  Right Ear: Hearing, tympanic membrane, external ear and ear canal normal.  Left Ear: Hearing, tympanic membrane, external ear and ear canal normal.  Nose: Nose normal.  Mouth/Throat: Uvula is midline, oropharynx is clear and moist and mucous membranes are normal.  Eyes: Conjunctivae normal, EOM and lids are normal. Pupils are equal, round, and reactive to light. No foreign bodies found.  Neck: Trachea normal, normal range of motion and phonation normal. Neck supple. Carotid bruit is not present. No mass and no thyromegaly present.  Cardiovascular: Normal rate, regular rhythm, S1 normal, S2 normal, intact distal pulses and normal pulses. Exam reveals no gallop.  No murmur heard.  Pulmonary/Chest: Breath sounds normal. He has no wheezes. He has no rhonchi. He has no rales.  Abdominal: Soft. Normal appearance and bowel sounds are normal. There is no hepatosplenomegaly. There is no tenderness. There is no rebound, no guarding and no CVA tenderness. No hernia. Hernia confirmed negative in the right inguinal area and confirmed negative in the left inguinal area.  Genitourinary: Testes normal and penis normal. Right testis shows no mass and no tenderness. Left testis shows no mass  and no tenderness. No paraphimosis or penile tenderness.  Lymphadenopathy:  He has no cervical adenopathy.  Right: No inguinal adenopathy present.  Left: No inguinal adenopathy present.  Neurological: He is alert. He has normal strength and normal reflexes. No cranial nerve deficit or sensory deficit. Gait normal.  Skin: Skin is warm, dry and intact. No rash noted.  Psychiatric: He has a normal mood and affect. His speech is normal and behavior is normal. Judgment normal.    Diabetic foot exam:  Normal inspection  No skin breakdown  No calluses  Normal DP pulses  Normal sensation to light touch and monofilament  Nails normal          ASSESSMENT AND PLAN:  The patient's preventative maintenance and recommended screening tests for an annual wellness exam were reviewed in full today. Brought up to date unless services declined.  Counselled on the importance of diet, exercise, and its role in overall health and mortality. The patient's FH and SH was reviewed, including their home life, tobacco status, and drug and alcohol status.   Vaccines: Due for PNA and tdap, had flu vaccine in last year No early family history of colon cancer or prostate cancer  HIV: refused.  Non smoker

## 2015-05-30 NOTE — Progress Notes (Signed)
Pre visit review using our clinic review tool, if applicable. No additional management support is needed unless otherwise documented below in the visit note. 

## 2015-05-30 NOTE — Addendum Note (Signed)
Addended by: Damita Lack on: 05/30/2015 08:20 AM   Modules accepted: Orders

## 2015-05-30 NOTE — Assessment & Plan Note (Signed)
Due for re-eval. Encouraged exercise, weight loss, healthy eating habits.  

## 2015-05-30 NOTE — Patient Instructions (Addendum)
Keep up working on low carb diet, stop juice, stay away from soda. Drink water, avoid pasta, potatos, rice, bread.  Return for fasting labs in next week. Remember to get yearly eye exam.

## 2015-06-08 ENCOUNTER — Other Ambulatory Visit (INDEPENDENT_AMBULATORY_CARE_PROVIDER_SITE_OTHER): Payer: BC Managed Care – PPO

## 2015-06-08 DIAGNOSIS — E039 Hypothyroidism, unspecified: Secondary | ICD-10-CM | POA: Diagnosis not present

## 2015-06-08 DIAGNOSIS — E1165 Type 2 diabetes mellitus with hyperglycemia: Secondary | ICD-10-CM | POA: Diagnosis not present

## 2015-06-08 DIAGNOSIS — E785 Hyperlipidemia, unspecified: Secondary | ICD-10-CM | POA: Diagnosis not present

## 2015-06-08 LAB — TSH: TSH: 20.35 u[IU]/mL — AB (ref 0.35–4.50)

## 2015-06-08 LAB — COMPREHENSIVE METABOLIC PANEL
ALK PHOS: 49 U/L (ref 39–117)
ALT: 49 U/L (ref 0–53)
AST: 36 U/L (ref 0–37)
Albumin: 4 g/dL (ref 3.5–5.2)
BILIRUBIN TOTAL: 0.5 mg/dL (ref 0.2–1.2)
BUN: 13 mg/dL (ref 6–23)
CALCIUM: 9.5 mg/dL (ref 8.4–10.5)
CO2: 27 mEq/L (ref 19–32)
CREATININE: 0.86 mg/dL (ref 0.40–1.50)
Chloride: 100 mEq/L (ref 96–112)
GFR: 101.04 mL/min (ref >60.00)
GLUCOSE: 314 mg/dL — AB (ref 70–99)
Potassium: 4.3 mEq/L (ref 3.5–5.1)
Sodium: 136 mEq/L (ref 135–145)
TOTAL PROTEIN: 7.7 g/dL (ref 6.0–8.3)

## 2015-06-08 LAB — T3, FREE: T3 FREE: 3.1 pg/mL (ref 2.3–4.2)

## 2015-06-08 LAB — LIPID PANEL
CHOL/HDL RATIO: 4
Cholesterol: 162 mg/dL (ref 0–200)
HDL: 39.8 mg/dL (ref >39.00)
LDL Cholesterol: 103 mg/dL — ABNORMAL HIGH (ref 0–99)
NonHDL: 122.34
TRIGLYCERIDES: 99 mg/dL (ref 0.0–149.0)
VLDL: 19.8 mg/dL (ref 0.0–40.0)

## 2015-06-08 LAB — HEMOGLOBIN A1C: Hgb A1c MFr Bld: 12.4 % — ABNORMAL HIGH (ref 4.6–6.5)

## 2015-06-08 LAB — T4, FREE: FREE T4: 0.8 ng/dL (ref 0.60–1.60)

## 2015-08-24 ENCOUNTER — Other Ambulatory Visit: Payer: Self-pay | Admitting: Family Medicine

## 2015-08-29 ENCOUNTER — Encounter: Payer: Self-pay | Admitting: Family Medicine

## 2015-08-29 ENCOUNTER — Ambulatory Visit (INDEPENDENT_AMBULATORY_CARE_PROVIDER_SITE_OTHER): Payer: BC Managed Care – PPO | Admitting: Family Medicine

## 2015-08-29 VITALS — BP 106/68 | HR 74 | Temp 98.4°F | Ht 71.05 in | Wt >= 6400 oz

## 2015-08-29 DIAGNOSIS — E785 Hyperlipidemia, unspecified: Secondary | ICD-10-CM

## 2015-08-29 DIAGNOSIS — I10 Essential (primary) hypertension: Secondary | ICD-10-CM | POA: Diagnosis not present

## 2015-08-29 DIAGNOSIS — E1165 Type 2 diabetes mellitus with hyperglycemia: Secondary | ICD-10-CM | POA: Diagnosis not present

## 2015-08-29 LAB — HM DIABETES FOOT EXAM

## 2015-08-29 LAB — HEMOGLOBIN A1C: Hgb A1c MFr Bld: 9.5 % — ABNORMAL HIGH (ref 4.6–6.5)

## 2015-08-29 NOTE — Assessment & Plan Note (Signed)
Continue working on low carb diet, weight loss and exercise.  Discussed victoza for weight loss, he will consider. He does not want to increase metformin at this tie, will see what A1C is today and in 3 months, if not at goal we will plan on adjusting med or adding victoza.

## 2015-08-29 NOTE — Assessment & Plan Note (Signed)
Well controlled. Continue current medication.  

## 2015-08-29 NOTE — Patient Instructions (Addendum)
Make sure to keep appt for eye in July.  Keep working on weight loss, exercise and low carb diet.  Stop at lab on way out.  Look into VICTOZA and Byetta and trulicity.

## 2015-08-29 NOTE — Progress Notes (Signed)
   Subjective:    Patient ID: [name], male    DOB: 1965/06/15, 50 y.o.   MRN: [id number]  HPI  50 year old male presents for 3 month follow up DM.  Hypertension: BP well controlled on lisinopril, HCTZ  BP Readings from Last 3 Encounters:  08/29/15 106/68  05/30/15 118/80  11/09/13 136/90   Using medication without problems or lightheadedness: None  Chest pain with exertion: none  Edema:None  Short of breath:None  Average home BPs:120/70 Other issues:   Elevated Cholesterol:  Almost at goal at last check on simvastatin  Lab Results  Component Value Date   CHOL 162 06/08/2015   HDL 39.80 06/08/2015   LDLCALC 103* 06/08/2015   LDLDIRECT 150.4 05/19/2007   TRIG 99.0 06/08/2015   CHOLHDL 4 06/08/2015  Using medications without problems:None  Muscle aches: None  Diet compliance: low carb diet,  Cut out honey.  Exercise: Has been walking more in last 3-4 weeks. Other complaints:   Wt Readings from Last 3 Encounters:  08/29/15 410 lb 4 oz (186.088 kg)  05/30/15 409 lb 8 oz (185.748 kg)  11/09/13 419 lb 8 oz (190.284 kg)   Diabetes: Due for re-eval. Very poor control He is on glipizide max and metfomrin 1000 mg twice daily. Lab Results  Component Value Date   HGBA1C 12.4* 06/08/2015  Using medications without difficulties:None  Hypoglycemic episodes:? Hyperglycemic episodes:?  Feet problems: No ulcer  Blood Sugars averaging: Occ FBS not sure, wife checks.140? eye exam within last year: scheduled in july                   Social History /Family History/Past Medical History reviewed and updated if needed.    Review of Systems  Constitutional: Negative for fatigue.  HENT: Negative for ear pain.   Eyes: Negative for pain.  Respiratory: Negative for cough.   Cardiovascular: Negative for chest pain.  Gastrointestinal: Negative for abdominal pain.       Objective:   Physical Exam  Constitutional: Vital signs are normal. He appears  well-developed and well-nourished.  Morbid obesity  HENT:  Head: Normocephalic.  Right Ear: Hearing normal.  Left Ear: Hearing normal.  Nose: Nose normal.  Mouth/Throat: Oropharynx is clear and moist and mucous membranes are normal.  Neck: Trachea normal. Carotid bruit is not present. No thyroid mass and no thyromegaly present.  Cardiovascular: Normal rate, regular rhythm and normal pulses.  Exam reveals no gallop, no distant heart sounds and no friction rub.   No murmur heard. No peripheral edema  Pulmonary/Chest: Effort normal and breath sounds normal. No respiratory distress.  Skin: Skin is warm, dry and intact. No rash noted.  Psychiatric: He has a normal mood and affect. His speech is normal and behavior is normal. Thought content normal.   Diabetic foot exam: Normal inspection No skin breakdown No calluses  Normal DP pulses Normal sensation to light touch and monofilament Nails normal         Assessment & Plan:

## 2015-08-29 NOTE — Progress Notes (Signed)
Pre visit review using our clinic review tool, if applicable. No additional management support is needed unless otherwise documented below in the visit note. 

## 2015-11-27 ENCOUNTER — Other Ambulatory Visit: Payer: BC Managed Care – PPO

## 2015-12-01 ENCOUNTER — Ambulatory Visit: Payer: BC Managed Care – PPO | Admitting: Family Medicine

## 2016-01-31 ENCOUNTER — Other Ambulatory Visit (INDEPENDENT_AMBULATORY_CARE_PROVIDER_SITE_OTHER): Payer: BC Managed Care – PPO

## 2016-01-31 ENCOUNTER — Telehealth: Payer: Self-pay | Admitting: Family Medicine

## 2016-01-31 DIAGNOSIS — E039 Hypothyroidism, unspecified: Secondary | ICD-10-CM

## 2016-01-31 DIAGNOSIS — E782 Mixed hyperlipidemia: Secondary | ICD-10-CM

## 2016-01-31 DIAGNOSIS — E1165 Type 2 diabetes mellitus with hyperglycemia: Secondary | ICD-10-CM

## 2016-01-31 LAB — COMPREHENSIVE METABOLIC PANEL
ALK PHOS: 53 U/L (ref 39–117)
ALT: 27 U/L (ref 0–53)
AST: 22 U/L (ref 0–37)
Albumin: 3.9 g/dL (ref 3.5–5.2)
BUN: 10 mg/dL (ref 6–23)
CO2: 29 meq/L (ref 19–32)
Calcium: 9.7 mg/dL (ref 8.4–10.5)
Chloride: 97 mEq/L (ref 96–112)
Creatinine, Ser: 0.76 mg/dL (ref 0.40–1.50)
GFR: 116.21 mL/min (ref >60.00)
GLUCOSE: 205 mg/dL — AB (ref 70–99)
POTASSIUM: 4.5 meq/L (ref 3.5–5.1)
SODIUM: 134 meq/L — AB (ref 135–145)
TOTAL PROTEIN: 7.6 g/dL (ref 6.0–8.3)
Total Bilirubin: 0.4 mg/dL (ref 0.2–1.2)

## 2016-01-31 LAB — T3, FREE: T3 FREE: 3.4 pg/mL (ref 2.3–4.2)

## 2016-01-31 LAB — LIPID PANEL
CHOL/HDL RATIO: 4
Cholesterol: 187 mg/dL (ref 0–200)
HDL: 43.3 mg/dL (ref >39.00)
LDL CALC: 117 mg/dL — AB (ref 0–99)
NONHDL: 143.7
Triglycerides: 132 mg/dL (ref 0.0–149.0)
VLDL: 26.4 mg/dL (ref 0.0–40.0)

## 2016-01-31 LAB — T4, FREE: Free T4: 0.77 ng/dL (ref 0.60–1.60)

## 2016-01-31 LAB — TSH: TSH: 14.56 u[IU]/mL — ABNORMAL HIGH (ref 0.35–4.50)

## 2016-01-31 LAB — HEMOGLOBIN A1C: Hgb A1c MFr Bld: 9.8 % — ABNORMAL HIGH (ref 4.6–6.5)

## 2016-01-31 NOTE — Telephone Encounter (Signed)
-----   Message from Alvina Chouerri J Walsh sent at 01/22/2016  4:22 PM EDT ----- Regarding: Lab orders for Monday 11 1.17 Lab orders for a 3 month follow up appt.

## 2016-02-06 ENCOUNTER — Ambulatory Visit: Payer: BC Managed Care – PPO | Admitting: Family Medicine

## 2016-02-09 ENCOUNTER — Encounter: Payer: Self-pay | Admitting: Family Medicine

## 2016-02-09 ENCOUNTER — Ambulatory Visit (INDEPENDENT_AMBULATORY_CARE_PROVIDER_SITE_OTHER): Payer: BC Managed Care – PPO | Admitting: Family Medicine

## 2016-02-09 VITALS — BP 124/84 | HR 74 | Temp 98.6°F | Ht 71.05 in | Wt >= 6400 oz

## 2016-02-09 DIAGNOSIS — E782 Mixed hyperlipidemia: Secondary | ICD-10-CM | POA: Diagnosis not present

## 2016-02-09 DIAGNOSIS — E039 Hypothyroidism, unspecified: Secondary | ICD-10-CM

## 2016-02-09 DIAGNOSIS — B9789 Other viral agents as the cause of diseases classified elsewhere: Secondary | ICD-10-CM

## 2016-02-09 DIAGNOSIS — J069 Acute upper respiratory infection, unspecified: Secondary | ICD-10-CM | POA: Insufficient documentation

## 2016-02-09 DIAGNOSIS — E1165 Type 2 diabetes mellitus with hyperglycemia: Secondary | ICD-10-CM | POA: Diagnosis not present

## 2016-02-09 DIAGNOSIS — I10 Essential (primary) hypertension: Secondary | ICD-10-CM

## 2016-02-09 LAB — HM DIABETES FOOT EXAM

## 2016-02-09 MED ORDER — METFORMIN HCL 850 MG PO TABS: 850.0000 mg | ORAL_TABLET | Freq: Three times a day (TID) | ORAL | 11 refills | Status: DC

## 2016-02-09 MED ORDER — GLIPIZIDE ER 10 MG PO TB24: ORAL_TABLET | ORAL | 3 refills | Status: DC

## 2016-02-09 MED ORDER — LEVOTHYROXINE SODIUM 50 MCG PO TABS: 50.0000 ug | ORAL_TABLET | Freq: Every day | ORAL | 3 refills | Status: DC

## 2016-02-09 MED ORDER — LISINOPRIL-HYDROCHLOROTHIAZIDE 10-12.5 MG PO TABS: 1.0000 | ORAL_TABLET | Freq: Every day | ORAL | 3 refills | Status: DC

## 2016-02-09 MED ORDER — SIMVASTATIN 40 MG PO TABS: 40.0000 mg | ORAL_TABLET | Freq: Every day | ORAL | 3 refills | Status: DC

## 2016-02-09 MED ORDER — LEVOTHYROXINE SODIUM 200 MCG PO TABS: ORAL_TABLET | ORAL | 3 refills | Status: DC

## 2016-02-09 NOTE — Assessment & Plan Note (Signed)
Increase to max metformin spread out doses. If not at goal next OV stop glipizide an add victoza.

## 2016-02-09 NOTE — Assessment & Plan Note (Signed)
Symptomatic care. Can try zyrtec for possible allergies as well.

## 2016-02-09 NOTE — Assessment & Plan Note (Signed)
Worsened control on statin moderate dose. Pt with reassume lifestyle changes.

## 2016-02-09 NOTE — Assessment & Plan Note (Signed)
Well controlled. Continue current medication.  

## 2016-02-09 NOTE — Patient Instructions (Signed)
Increase metformin to 3 tabs daily.Marland Kitchen. Spread out at breakfast lunch and dinner.  Check fasting blood sugars, goal FBS < 120, 2 hour after meals < 180.  Work on low carb and low cholesterol diet.  Increase exercise.

## 2016-02-09 NOTE — Progress Notes (Signed)
Pre visit review using our clinic review tool, if applicable. No additional management support is needed unless otherwise documented below in the visit note. 

## 2016-02-09 NOTE — Progress Notes (Signed)
50 year old male presents for 3 month follow up DM.  He has been dealing with a cold for 3 weeks. Started with sore throat, mucus and productive cough. Pain in left ear initial, no facial pain.  Has tried multiple OTC meds.. Minimal relief. Not going away. No SOB, no fever. Fels well overall.  Hypertension: BP well controlled on lisinopril, HCTZ  BP Readings from Last 3 Encounters:  02/09/16 124/84  08/29/15 106/68  05/30/15 118/80  Using medication without problems or lightheadedness: None  Chest pain with exertion: none  Edema: off and on if on feet all day Short of breath:None  Average home BPs:120/70 Other issues:   Elevated Cholesterol:  Almost at goal  on simvastatin 40 Lab Results  Component Value Date   CHOL 187 01/31/2016   HDL 43.30 01/31/2016   LDLCALC 117 (H) 01/31/2016   LDLDIRECT 150.4 05/19/2007   TRIG 132.0 01/31/2016   CHOLHDL 4 01/31/2016   Using medications without problems:None  Muscle aches: None  Diet compliance:  Did not make changes with diet. Exercise: Has been walking more in last 3-4 weeks. Other complaints:   Wt Readings from Last 3 Encounters:  02/09/16 (!) 418 lb 4 oz (189.7 kg)  08/29/15 (!) 410 lb 4 oz (186.1 kg)  05/30/15 (!) 409 lb 8 oz (185.7 kg)   Diabetes: Very poor control He is on glipizide max and metfomrin 1000 mg twice daily. Lab Results  Component Value Date   HGBA1C 9.8 (H) 01/31/2016   Using medications without difficulties:None  Hypoglycemic episodes:? Hyperglycemic episodes:?  Feet problems: No ulcer  Blood Sugars averaging: Occ FBS not sure, wife checks.140? eye exam within last year: yes.   Hypothyroid: T3 and T4 at goal on levothyroxine 250 mcg daily.                  Social History /Family History/Past Medical History reviewed and updated if needed.    Review of Systems  Constitutional: Negative for fatigue.  HENT: Negative for ear pain.   Eyes: Negative for pain.   Respiratory: Negative for cough.   Cardiovascular: Negative for chest pain.  Gastrointestinal: Negative for abdominal pain.       Objective:   Physical Exam  Constitutional: Vital signs are normal. He appears well-developed and well-nourished.  Morbid obesity  HENT:  Head: Normocephalic.  Right Ear: Hearing normal.  Left Ear: Hearing normal.  Nose: Nose normal.  Mouth/Throat: Oropharynx is clear and moist and mucous membranes are normal.  Neck: Trachea normal. Carotid bruit is not present. No thyroid mass and no thyromegaly present.  Cardiovascular: Normal rate, regular rhythm and normal pulses.  Exam reveals no gallop, no distant heart sounds and no friction rub.   No murmur heard. No peripheral edema  Pulmonary/Chest: Effort normal and breath sounds normal. No respiratory distress.  Skin: Skin is warm, dry and intact. No rash noted.  Psychiatric: He has a normal mood and affect. His speech is normal and behavior is normal. Thought content normal.   Diabetic foot exam: Normal inspection No skin breakdown No calluses  Normal DP pulses Normal sensation to light touch and monofilament Nails normal

## 2016-05-09 ENCOUNTER — Telehealth: Payer: Self-pay | Admitting: Family Medicine

## 2016-05-09 ENCOUNTER — Other Ambulatory Visit: Payer: BC Managed Care – PPO

## 2016-05-09 DIAGNOSIS — E039 Hypothyroidism, unspecified: Secondary | ICD-10-CM

## 2016-05-09 DIAGNOSIS — E782 Mixed hyperlipidemia: Secondary | ICD-10-CM

## 2016-05-09 DIAGNOSIS — E1165 Type 2 diabetes mellitus with hyperglycemia: Secondary | ICD-10-CM

## 2016-05-09 NOTE — Telephone Encounter (Signed)
-----   Message from Baldomero LamyNatasha C Chavers sent at 04/26/2016  3:31 PM EST ----- Regarding: Cpx Thurs 2/8, need orders. Thanks :-) Please order  future f/u labs for pt's upcoming lab appt. Thanks Rodney Boozeasha

## 2016-05-13 ENCOUNTER — Ambulatory Visit: Payer: BC Managed Care – PPO | Admitting: Family Medicine

## 2016-05-23 LAB — HM DIABETES EYE EXAM

## 2016-05-24 ENCOUNTER — Encounter: Payer: Self-pay | Admitting: Family Medicine

## 2016-07-03 ENCOUNTER — Other Ambulatory Visit (INDEPENDENT_AMBULATORY_CARE_PROVIDER_SITE_OTHER): Payer: BC Managed Care – PPO

## 2016-07-03 DIAGNOSIS — E1165 Type 2 diabetes mellitus with hyperglycemia: Secondary | ICD-10-CM | POA: Diagnosis not present

## 2016-07-03 DIAGNOSIS — E782 Mixed hyperlipidemia: Secondary | ICD-10-CM | POA: Diagnosis not present

## 2016-07-03 LAB — COMPREHENSIVE METABOLIC PANEL
ALBUMIN: 4 g/dL (ref 3.5–5.2)
ALK PHOS: 42 U/L (ref 39–117)
ALT: 31 U/L (ref 0–53)
AST: 25 U/L (ref 0–37)
BILIRUBIN TOTAL: 0.5 mg/dL (ref 0.2–1.2)
BUN: 11 mg/dL (ref 6–23)
CALCIUM: 9.5 mg/dL (ref 8.4–10.5)
CO2: 29 meq/L (ref 19–32)
Chloride: 101 mEq/L (ref 96–112)
Creatinine, Ser: 0.81 mg/dL (ref 0.40–1.50)
GFR: 107.78 mL/min (ref >60.00)
Glucose, Bld: 125 mg/dL — ABNORMAL HIGH (ref 70–99)
Potassium: 4 mEq/L (ref 3.5–5.1)
SODIUM: 137 meq/L (ref 135–145)
Total Protein: 7.4 g/dL (ref 6.0–8.3)

## 2016-07-03 LAB — HEMOGLOBIN A1C: Hgb A1c MFr Bld: 6.6 % — ABNORMAL HIGH (ref 4.6–6.5)

## 2016-07-03 LAB — LIPID PANEL
CHOLESTEROL: 125 mg/dL (ref 0–200)
HDL: 33.2 mg/dL — ABNORMAL LOW (ref >39.00)
LDL Cholesterol: 75 mg/dL (ref 0–99)
NonHDL: 92.12
TRIGLYCERIDES: 88 mg/dL (ref 0.0–149.0)
Total CHOL/HDL Ratio: 4
VLDL: 17.6 mg/dL (ref 0.0–40.0)

## 2016-07-05 ENCOUNTER — Encounter: Payer: Self-pay | Admitting: Family Medicine

## 2016-07-05 ENCOUNTER — Ambulatory Visit (INDEPENDENT_AMBULATORY_CARE_PROVIDER_SITE_OTHER): Payer: BC Managed Care – PPO | Admitting: Family Medicine

## 2016-07-05 DIAGNOSIS — E782 Mixed hyperlipidemia: Secondary | ICD-10-CM

## 2016-07-05 DIAGNOSIS — I10 Essential (primary) hypertension: Secondary | ICD-10-CM

## 2016-07-05 DIAGNOSIS — E1165 Type 2 diabetes mellitus with hyperglycemia: Secondary | ICD-10-CM | POA: Diagnosis not present

## 2016-07-05 LAB — HM DIABETES FOOT EXAM

## 2016-07-05 NOTE — Progress Notes (Signed)
Pre visit review using our clinic review tool, if applicable. No additional management support is needed unless otherwise documented below in the visit note. 

## 2016-07-05 NOTE — Progress Notes (Signed)
   Subjective:    Patient ID: [name], male    DOB: May 15, 1965, 51 y.o.   MRN: [id number]  HPI   51 year old male presents for follow up HTN, high cholesterol., morbid obesity, DM.    Wt Readings from Last 3 Encounters:  07/05/16 (!) 376 lb (170.6 kg)  02/09/16 (!) 418 lb 4 oz (189.7 kg)  08/29/15 (!) 410 lb 4 oz (186.1 kg)   He has lost almost 30 lbs in last 6 months.   Hypertension:   Well controlled on lisinopril HCTZ. BP Readings from Last 3 Encounters:  07/05/16 112/72  02/09/16 124/84  08/29/15 106/68  Using medication without problems or lightheadedness:  none Chest pain with exertion: none Edema:none Short of breath: none Average home BPs: 105-115/68 Other issues:   Elevated Cholesterol:  LDL at goal on simvastatin 40 mg daily. Lab Results  Component Value Date   CHOL 125 07/03/2016   HDL 33.20 (L) 07/03/2016   LDLCALC 75 07/03/2016   LDLDIRECT 150.4 05/19/2007   TRIG 88.0 07/03/2016   CHOLHDL 4 07/03/2016  Using medications without problems:no Diet compliance: Great Exercise: walking 1-2 miles a day Other complaints:   Diabetes:  Now at goal on glucotrol and metformin. A1C down from 9.8!!!! Lab Results  Component Value Date   HGBA1C 6.6 (H) 07/03/2016  Using medications without difficulties: Hypoglycemic episodes:none Hyperglycemic episodes:none Feet problems: none Blood Sugars averaging: FBS 100  She has been doing weight watchers, not eating out, low carb. eye exam within last year: yes    Review of Systems  Constitutional: Negative for fatigue and fever.  HENT: Negative for ear pain.   Eyes: Negative for pain.  Respiratory: Negative for cough and shortness of breath.   Cardiovascular: Negative for chest pain, palpitations and leg swelling.  Gastrointestinal: Negative for abdominal pain.  Genitourinary: Negative for dysuria.  Musculoskeletal: Negative for arthralgias.  Neurological: Negative for syncope, light-headedness and  headaches.  Psychiatric/Behavioral: Negative for dysphoric mood.       Objective:   Physical Exam  Constitutional: Vital signs are normal. He appears well-developed and well-nourished.  HENT:  Head: Normocephalic.  Right Ear: Hearing normal.  Left Ear: Hearing normal.  Nose: Nose normal.  Mouth/Throat: Oropharynx is clear and moist and mucous membranes are normal.  Neck: Trachea normal. Carotid bruit is not present. No thyroid mass and no thyromegaly present.  Cardiovascular: Normal rate, regular rhythm and normal pulses.  Exam reveals no gallop, no distant heart sounds and no friction rub.   No murmur heard. No peripheral edema  Pulmonary/Chest: Effort normal and breath sounds normal. No respiratory distress.  Skin: Skin is warm, dry and intact. No rash noted.  Psychiatric: He has a normal mood and affect. His speech is normal and behavior is normal. Thought content normal.     Diabetic foot exam: Normal inspection No skin breakdown No calluses  Normal DP pulses Normal sensation to light touch and monofilament Nails normal      Assessment & Plan:

## 2016-08-19 NOTE — Assessment & Plan Note (Signed)
Well controlled. Continue current medication. Encouraged exercise, weight loss, healthy eating habits.  

## 2016-08-19 NOTE — Assessment & Plan Note (Signed)
Well controlled. Continue current medication.  

## 2016-08-19 NOTE — Assessment & Plan Note (Signed)
Improved control on current regimen on glucotrol and metformin.

## 2016-12-25 ENCOUNTER — Telehealth: Payer: Self-pay | Admitting: Family Medicine

## 2016-12-25 ENCOUNTER — Other Ambulatory Visit (INDEPENDENT_AMBULATORY_CARE_PROVIDER_SITE_OTHER): Payer: BC Managed Care – PPO

## 2016-12-25 DIAGNOSIS — E782 Mixed hyperlipidemia: Secondary | ICD-10-CM | POA: Diagnosis not present

## 2016-12-25 DIAGNOSIS — E1165 Type 2 diabetes mellitus with hyperglycemia: Secondary | ICD-10-CM | POA: Diagnosis not present

## 2016-12-25 DIAGNOSIS — E039 Hypothyroidism, unspecified: Secondary | ICD-10-CM

## 2016-12-25 LAB — COMPREHENSIVE METABOLIC PANEL
ALBUMIN: 3.8 g/dL (ref 3.5–5.2)
ALK PHOS: 51 U/L (ref 39–117)
ALT: 28 U/L (ref 0–53)
AST: 25 U/L (ref 0–37)
BILIRUBIN TOTAL: 0.6 mg/dL (ref 0.2–1.2)
BUN: 14 mg/dL (ref 6–23)
CO2: 29 mEq/L (ref 19–32)
CREATININE: 0.76 mg/dL (ref 0.40–1.50)
Calcium: 9.5 mg/dL (ref 8.4–10.5)
Chloride: 102 mEq/L (ref 96–112)
GFR: 115.78 mL/min (ref >60.00)
GLUCOSE: 99 mg/dL (ref 70–99)
Potassium: 4.2 mEq/L (ref 3.5–5.1)
SODIUM: 136 meq/L (ref 135–145)
TOTAL PROTEIN: 7.4 g/dL (ref 6.0–8.3)

## 2016-12-25 LAB — LIPID PANEL
CHOLESTEROL: 123 mg/dL (ref 0–200)
HDL: 32.6 mg/dL — ABNORMAL LOW (ref >39.00)
LDL Cholesterol: 76 mg/dL (ref 0–99)
NONHDL: 90.01
Total CHOL/HDL Ratio: 4
Triglycerides: 71 mg/dL (ref 0.0–149.0)
VLDL: 14.2 mg/dL (ref 0.0–40.0)

## 2016-12-25 LAB — HEMOGLOBIN A1C: HEMOGLOBIN A1C: 5.6 % (ref 4.6–6.5)

## 2016-12-25 NOTE — Telephone Encounter (Signed)
-----   Message from Alvina Chou sent at 12/16/2016 10:49 AM EDT ----- Regarding: Lab orders for Wednesday, 9.26.18 Lab orders for a f/u appt

## 2017-01-02 ENCOUNTER — Encounter: Payer: Self-pay | Admitting: Family Medicine

## 2017-01-02 NOTE — Telephone Encounter (Signed)
[name].. Call him and if he still wants to come in and just discuss the labs and schedule CPX later that is fine with me... Is his slot already gone?

## 2017-01-03 ENCOUNTER — Encounter: Payer: Self-pay | Admitting: Family Medicine

## 2017-01-03 ENCOUNTER — Ambulatory Visit (INDEPENDENT_AMBULATORY_CARE_PROVIDER_SITE_OTHER): Payer: BC Managed Care – PPO | Admitting: Family Medicine

## 2017-01-03 ENCOUNTER — Ambulatory Visit: Payer: BC Managed Care – PPO | Admitting: Family Medicine

## 2017-01-03 DIAGNOSIS — E039 Hypothyroidism, unspecified: Secondary | ICD-10-CM

## 2017-01-03 DIAGNOSIS — E782 Mixed hyperlipidemia: Secondary | ICD-10-CM | POA: Diagnosis not present

## 2017-01-03 DIAGNOSIS — I10 Essential (primary) hypertension: Secondary | ICD-10-CM

## 2017-01-03 DIAGNOSIS — E119 Type 2 diabetes mellitus without complications: Secondary | ICD-10-CM

## 2017-01-03 LAB — HM DIABETES FOOT EXAM

## 2017-01-03 MED ORDER — LISINOPRIL 10 MG PO TABS: 10.0000 mg | ORAL_TABLET | Freq: Every day | ORAL | 3 refills | Status: DC

## 2017-01-03 NOTE — Assessment & Plan Note (Signed)
Excellent control with weight loss.. Stop HCTZ and continue lisinopril.

## 2017-01-03 NOTE — Patient Instructions (Addendum)
Keep up the great work!  Stop glipizide, continue metformin 2 tabs daily.  Change lisinopril/HCTZ to lisinopril alone.  Follow BP and and sugars closely with theses changes.

## 2017-01-03 NOTE — Assessment & Plan Note (Signed)
Excellent improvement with weight loss.. Continue metformin and stop glipizide.

## 2017-01-03 NOTE — Progress Notes (Signed)
   Subjective:    Patient ID: [name], male    DOB: [birth year], 51 y.o.   MRN: [id number]  HPI  51 year old male presents for lab review, DM follow up. Has upcoming CPX scheduled.  Diabetes:   Excellent improvement in a1C in last year.Marland Kitchen 9.8 to 6.6 to 5.6!  On glipizide 10 and metformin 850 mg .. He has been only been taking 2 a day. Lab Results  Component Value Date   HGBA1C 5.6 12/25/2016  Using medications without difficulties: Hypoglycemic episodes:none Hyperglycemic episodes:none Feet problems:none Blood Sugars averaging:not checking eye exam within last year: 05/2016   Hypertension:   BP at goal on lisinopril HCTZ.  Using medication without problems or lightheadedness:  none Chest pain with exertion: none Edema: none Short of breath: none Average home BP: 105-110/65-72 Other issues:   Elevated Cholesterol: Great control on simvastatin 40 mg daily. Lab Results  Component Value Date   CHOL 123 12/25/2016   HDL 32.60 (L) 12/25/2016   LDLCALC 76 12/25/2016   LDLDIRECT 150.4 05/19/2007   TRIG 71.0 12/25/2016   CHOLHDL 4 12/25/2016  Using medications without problems: Muscle aches:  Diet compliance: weight watchers Exercise: 15-20 miles a week, treadmill Other complaints:  He has lost 80 lbs in last year!! Wt Readings from Last 3 Encounters:  01/03/17 (!) 335 lb 8 oz (152.2 kg)  07/05/16 (!) 376 lb (170.6 kg)  02/09/16 (!) 418 lb 4 oz (189.7 kg)    Hypothyroid  Du for re-eval.. Add to next labs. Lab Results  Component Value Date   TSH 14.56 (H) 01/31/2016   Social History /Family History/Past Medical History reviewed in detail and updated in EMR if needed. Blood pressure 106/68, pulse (!) 59, temperature 98.2 F (36.8 C), temperature source Oral, height 5' 11.5" (1.816 m), weight (!) 335 lb 8 oz (152.2 kg).   Review of Systems  Constitutional: Negative for fatigue and fever.  HENT: Negative for ear pain.   Eyes: Negative for pain.  Respiratory:  Negative for cough and shortness of breath.   Cardiovascular: Negative for chest pain, palpitations and leg swelling.  Gastrointestinal: Negative for abdominal pain.  Genitourinary: Negative for dysuria.  Musculoskeletal: Negative for arthralgias.  Neurological: Negative for syncope, light-headedness and headaches.  Psychiatric/Behavioral: Negative for dysphoric mood.       Objective:   Physical Exam  Constitutional: Vital signs are normal. He appears well-developed and well-nourished.  HENT:  Head: Normocephalic.  Right Ear: Hearing normal.  Left Ear: Hearing normal.  Nose: Nose normal.  Mouth/Throat: Oropharynx is clear and moist and mucous membranes are normal.  Neck: Trachea normal. Carotid bruit is not present. No thyroid mass and no thyromegaly present.  Cardiovascular: Normal rate, regular rhythm and normal pulses.  Exam reveals no gallop, no distant heart sounds and no friction rub.   No murmur heard. No peripheral edema  Pulmonary/Chest: Effort normal and breath sounds normal. No respiratory distress.  Skin: Skin is warm, dry and intact. No rash noted.  Psychiatric: He has a normal mood and affect. His speech is normal and behavior is normal. Thought content normal.    Diabetic foot exam: Normal inspection No skin breakdown No calluses  Normal DP pulses Normal sensation to light touch and monofilament Nails normal       Assessment & Plan:

## 2017-01-03 NOTE — Assessment & Plan Note (Signed)
Re-eval with next set of labs.

## 2017-01-03 NOTE — Assessment & Plan Note (Signed)
Now at goal with lifestyle and statin medication.

## 2017-01-03 NOTE — Assessment & Plan Note (Signed)
Encouraged exercise, weight loss, healthy eating habits. Pt has been very successful with weight watchers and exercise!

## 2017-02-15 ENCOUNTER — Other Ambulatory Visit: Payer: Self-pay | Admitting: Family Medicine

## 2017-03-19 ENCOUNTER — Other Ambulatory Visit: Payer: Self-pay | Admitting: Family Medicine

## 2017-04-14 ENCOUNTER — Telehealth: Payer: Self-pay | Admitting: Family Medicine

## 2017-04-14 ENCOUNTER — Other Ambulatory Visit: Payer: BC Managed Care – PPO

## 2017-04-14 DIAGNOSIS — E119 Type 2 diabetes mellitus without complications: Secondary | ICD-10-CM

## 2017-04-14 DIAGNOSIS — E039 Hypothyroidism, unspecified: Secondary | ICD-10-CM

## 2017-04-14 NOTE — Telephone Encounter (Signed)
-----   Message from Alvina Chouerri J Walsh sent at 04/09/2017  2:34 PM EST ----- Regarding: Lab orders for Monday, 1.14.19 Patient is scheduled for CPX labs, please order future labs, Thanks , Camelia Engerri

## 2017-04-18 ENCOUNTER — Encounter: Payer: BC Managed Care – PPO | Admitting: Family Medicine

## 2017-06-16 ENCOUNTER — Other Ambulatory Visit: Payer: Self-pay | Admitting: Family Medicine

## 2017-06-24 LAB — HM DIABETES EYE EXAM

## 2017-06-27 ENCOUNTER — Encounter: Payer: Self-pay | Admitting: Family Medicine

## 2017-07-10 ENCOUNTER — Other Ambulatory Visit (INDEPENDENT_AMBULATORY_CARE_PROVIDER_SITE_OTHER): Payer: BC Managed Care – PPO

## 2017-07-10 ENCOUNTER — Other Ambulatory Visit: Payer: Self-pay | Admitting: Family Medicine

## 2017-07-10 DIAGNOSIS — E039 Hypothyroidism, unspecified: Secondary | ICD-10-CM

## 2017-07-10 DIAGNOSIS — E119 Type 2 diabetes mellitus without complications: Secondary | ICD-10-CM

## 2017-07-10 LAB — COMPREHENSIVE METABOLIC PANEL
ALT: 13 U/L (ref 0–53)
AST: 16 U/L (ref 0–37)
Albumin: 4 g/dL (ref 3.5–5.2)
Alkaline Phosphatase: 45 U/L (ref 39–117)
BILIRUBIN TOTAL: 0.7 mg/dL (ref 0.2–1.2)
BUN: 13 mg/dL (ref 6–23)
CALCIUM: 9.5 mg/dL (ref 8.4–10.5)
CO2: 28 meq/L (ref 19–32)
CREATININE: 0.87 mg/dL (ref 0.40–1.50)
Chloride: 101 mEq/L (ref 96–112)
GFR: 98.84 mL/min (ref >60.00)
GLUCOSE: 90 mg/dL (ref 70–99)
Potassium: 4.1 mEq/L (ref 3.5–5.1)
Sodium: 135 mEq/L (ref 135–145)
Total Protein: 7.7 g/dL (ref 6.0–8.3)

## 2017-07-10 LAB — TSH: TSH: 3.12 u[IU]/mL (ref 0.35–4.50)

## 2017-07-10 LAB — T3, FREE: T3, Free: 2.5 pg/mL (ref 2.3–4.2)

## 2017-07-10 LAB — LIPID PANEL
CHOL/HDL RATIO: 4
Cholesterol: 149 mg/dL (ref 0–200)
HDL: 41.9 mg/dL (ref >39.00)
LDL Cholesterol: 93 mg/dL (ref 0–99)
NONHDL: 107.33
Triglycerides: 70 mg/dL (ref 0.0–149.0)
VLDL: 14 mg/dL (ref 0.0–40.0)

## 2017-07-10 LAB — HEMOGLOBIN A1C: Hgb A1c MFr Bld: 5.5 % (ref 4.6–6.5)

## 2017-07-10 LAB — T4, FREE: FREE T4: 0.99 ng/dL (ref 0.60–1.60)

## 2017-07-17 ENCOUNTER — Telehealth: Payer: Self-pay | Admitting: *Deleted

## 2017-07-17 ENCOUNTER — Encounter: Payer: Self-pay | Admitting: Family Medicine

## 2017-07-17 ENCOUNTER — Other Ambulatory Visit: Payer: Self-pay

## 2017-07-17 ENCOUNTER — Ambulatory Visit (INDEPENDENT_AMBULATORY_CARE_PROVIDER_SITE_OTHER): Payer: BC Managed Care – PPO | Admitting: Family Medicine

## 2017-07-17 VITALS — BP 110/72 | HR 57 | Temp 97.6°F | Ht 71.5 in | Wt 337.2 lb

## 2017-07-17 DIAGNOSIS — E782 Mixed hyperlipidemia: Secondary | ICD-10-CM | POA: Diagnosis not present

## 2017-07-17 DIAGNOSIS — I10 Essential (primary) hypertension: Secondary | ICD-10-CM | POA: Diagnosis not present

## 2017-07-17 DIAGNOSIS — E039 Hypothyroidism, unspecified: Secondary | ICD-10-CM | POA: Diagnosis not present

## 2017-07-17 DIAGNOSIS — Z0000 Encounter for general adult medical examination without abnormal findings: Secondary | ICD-10-CM | POA: Diagnosis not present

## 2017-07-17 DIAGNOSIS — E119 Type 2 diabetes mellitus without complications: Secondary | ICD-10-CM | POA: Diagnosis not present

## 2017-07-17 MED ORDER — METFORMIN HCL ER (MOD) 500 MG PO TB24: 500.0000 mg | ORAL_TABLET | Freq: Every day | ORAL | 3 refills | Status: DC

## 2017-07-17 MED ORDER — METFORMIN HCL ER 500 MG PO TB24: 500.0000 mg | ORAL_TABLET | Freq: Every day | ORAL | 1 refills | Status: DC

## 2017-07-17 NOTE — Assessment & Plan Note (Signed)
At gaol on current dose.

## 2017-07-17 NOTE — Telephone Encounter (Signed)
Change to regular er tabs

## 2017-07-17 NOTE — Assessment & Plan Note (Signed)
Excellent improvement.. 40 lBs in last 6 months.. 100 lbs in last 1-2 years. Encouraged exercise, weight loss, healthy eating habits.

## 2017-07-17 NOTE — Assessment & Plan Note (Signed)
At goal LDL < 100, HDl now at goal on simvastatin.

## 2017-07-17 NOTE — Assessment & Plan Note (Signed)
Well controlled. Continue current medication.  

## 2017-07-17 NOTE — Telephone Encounter (Signed)
Received fax from [facility] requesting alternative.  Modified Metformin Tabs require PA.  Regular ER tabs are available.  Please advise.  Start PA or chang Metformin to regular ER tabs?

## 2017-07-17 NOTE — Telephone Encounter (Signed)
Rx sent to [facility] on [address] as instructed by Dr. Ermalene SearingBedsole.

## 2017-07-17 NOTE — Patient Instructions (Addendum)
Decreased down metformin further to 1  A day of 500 mg ER.  Great work .Marland Kitchen. Keep it up!

## 2017-07-17 NOTE — Progress Notes (Signed)
Subjective:    Patient ID: [name], male    DOB: 1965/09/14, 52 y.o.   MRN: [id number]  HPI   The patient is here for annual wellness exam and preventative care.    Diabetes:   Has decreased metformin to 2 a day.  Lab Results  Component Value Date   HGBA1C 5.5 07/10/2017  Using medications without difficulties: Hypoglycemic episodes:? Hyperglycemic episodes:? Feet problems: no ulcers Blood Sugars averaging: not checking eye exam within last year: 05/2017  Elevated Cholesterol:  At goal LDL < 100, HDl now at goal. On simvastatin. Lab Results  Component Value Date   CHOL 149 07/10/2017   HDL 41.90 07/10/2017   LDLCALC 93 07/10/2017   LDLDIRECT 150.4 05/19/2007   TRIG 70.0 07/10/2017   CHOLHDL 4 07/10/2017  Using medications without problems: Muscle aches:  Diet compliance: moderate Exercise: walks at work.. 4 miles a day. Other complaints:  Has lost  40 lbs in last year. Wt Readings from Last 3 Encounters:  07/17/17 (!) 337 lb 4 oz (153 kg)  01/03/17 (!) 335 lb 8 oz (152.2 kg)  07/05/16 (!) 376 lb (170.6 kg)     Thyroid  Lab Results  Component Value Date   TSH 3.12 07/10/2017    Hypertension:   Good control on lisinopril 10 mg daily.  Using medication without problems or lightheadedness:  none Chest pain with exertion: none Edema: none Short of breath: none  Average home BPs: Other issues:   Blood pressure 110/72, pulse (!) 57, temperature 97.6 F (36.4 C), temperature source Oral, height 5' 11.5" (1.816 m), weight (!) 337 lb 4 oz (153 kg).   Review of Systems  Constitutional: Negative for fatigue and fever.  HENT: Negative for ear pain.   Eyes: Negative for pain.  Respiratory: Negative for cough and shortness of breath.   Cardiovascular: Negative for chest pain, palpitations and leg swelling.  Gastrointestinal: Negative for abdominal pain.  Genitourinary: Negative for dysuria.  Musculoskeletal: Negative for arthralgias.  Neurological:  Negative for syncope, light-headedness and headaches.  Psychiatric/Behavioral: Negative for dysphoric mood.       Objective:   Physical Exam  Constitutional: He appears well-developed and well-nourished.  Non-toxic appearance. He does not appear ill. No distress.  Morbid obesity  HENT:  Head: Normocephalic and atraumatic.  Right Ear: Hearing, tympanic membrane, external ear and ear canal normal.  Left Ear: Hearing, tympanic membrane, external ear and ear canal normal.  Nose: Nose normal.  Mouth/Throat: Uvula is midline, oropharynx is clear and moist and mucous membranes are normal.  Eyes: Pupils are equal, round, and reactive to light. Conjunctivae, EOM and lids are normal. Lids are everted and swept, no foreign bodies found.  Neck: Trachea normal, normal range of motion and phonation normal. Neck supple. Carotid bruit is not present. No thyroid mass and no thyromegaly present.  Cardiovascular: Normal rate, regular rhythm, S1 normal, S2 normal, intact distal pulses and normal pulses. Exam reveals no gallop.  No murmur heard. Pulmonary/Chest: Breath sounds normal. He has no wheezes. He has no rhonchi. He has no rales.  Abdominal: Soft. Normal appearance and bowel sounds are normal. There is no hepatosplenomegaly. There is no tenderness. There is no rebound, no guarding and no CVA tenderness. No hernia.  Lymphadenopathy:    He has no cervical adenopathy.  Neurological: He is alert. He has normal strength and normal reflexes. No cranial nerve deficit or sensory deficit. Gait normal.  Skin: Skin is warm, dry and intact. No rash  noted.  Psychiatric: He has a normal mood and affect. His speech is normal and behavior is normal. Judgment normal.          Assessment & Plan:  The patient's preventative maintenance and recommended screening tests for an annual wellness exam were reviewed in full today. Brought up to date unless services declined.  Counselled on the importance of diet,  exercise, and its role in overall health and mortality. The patient's FH and SH was reviewed, including their home life, tobacco status, and drug and alcohol status.     Vaccines: update Prostate Cancer Screen: no early family history Colon Cancer Screen:  no early family history      Smoking Status: none ETOH/ drug use: none/none   HIV screen:  Refused.

## 2017-09-08 ENCOUNTER — Other Ambulatory Visit: Payer: Self-pay | Admitting: Family Medicine

## 2017-10-13 ENCOUNTER — Other Ambulatory Visit: Payer: Self-pay | Admitting: Family Medicine

## 2017-12-29 ENCOUNTER — Telehealth: Payer: Self-pay | Admitting: Family Medicine

## 2017-12-29 DIAGNOSIS — E119 Type 2 diabetes mellitus without complications: Secondary | ICD-10-CM

## 2017-12-29 DIAGNOSIS — E039 Hypothyroidism, unspecified: Secondary | ICD-10-CM

## 2017-12-29 NOTE — Telephone Encounter (Signed)
-----   Message from Wendi Maya, RT sent at 12/29/2017  9:01 AM EDT ----- Regarding: Lab orders for Tuesday 01/06/18 Please enter lab orders for DM follow up. Thanks!

## 2018-01-06 ENCOUNTER — Other Ambulatory Visit: Payer: BC Managed Care – PPO

## 2018-01-13 ENCOUNTER — Ambulatory Visit: Payer: BC Managed Care – PPO | Admitting: Family Medicine

## 2018-01-26 ENCOUNTER — Other Ambulatory Visit: Payer: Self-pay | Admitting: Family Medicine

## 2018-01-29 ENCOUNTER — Other Ambulatory Visit: Payer: Self-pay | Admitting: Family Medicine

## 2018-01-29 NOTE — Telephone Encounter (Signed)
Last annual exam on 07/17/17 and pt has 6 mth appt on 04/21/18. Refilled per protocol # [facility] x [facility] to [facility] [address].

## 2018-02-20 ENCOUNTER — Other Ambulatory Visit: Payer: Self-pay | Admitting: Family Medicine

## 2018-04-10 ENCOUNTER — Other Ambulatory Visit: Payer: Self-pay | Admitting: Family Medicine

## 2018-04-16 ENCOUNTER — Telehealth: Payer: Self-pay | Admitting: Family Medicine

## 2018-04-16 DIAGNOSIS — E782 Mixed hyperlipidemia: Secondary | ICD-10-CM

## 2018-04-16 DIAGNOSIS — E119 Type 2 diabetes mellitus without complications: Secondary | ICD-10-CM

## 2018-04-16 DIAGNOSIS — E039 Hypothyroidism, unspecified: Secondary | ICD-10-CM

## 2018-04-16 NOTE — Telephone Encounter (Signed)
-----   Message from Alvina Chou sent at 04/06/2018 10:43 AM EST ----- Regarding: Lab orders for Friday, 1.17.20 Lab orders for a 6 month follow up appt

## 2018-04-17 ENCOUNTER — Other Ambulatory Visit: Payer: BC Managed Care – PPO

## 2018-04-21 ENCOUNTER — Ambulatory Visit: Payer: BC Managed Care – PPO | Admitting: Family Medicine

## 2018-06-11 ENCOUNTER — Other Ambulatory Visit (INDEPENDENT_AMBULATORY_CARE_PROVIDER_SITE_OTHER): Payer: BC Managed Care – PPO

## 2018-06-11 ENCOUNTER — Other Ambulatory Visit: Payer: Self-pay

## 2018-06-11 DIAGNOSIS — E119 Type 2 diabetes mellitus without complications: Secondary | ICD-10-CM | POA: Diagnosis not present

## 2018-06-11 LAB — COMPREHENSIVE METABOLIC PANEL
ALBUMIN: 4 g/dL (ref 3.5–5.2)
ALK PHOS: 46 U/L (ref 39–117)
ALT: 16 U/L (ref 0–53)
AST: 18 U/L (ref 0–37)
BUN: 14 mg/dL (ref 6–23)
CALCIUM: 9.4 mg/dL (ref 8.4–10.5)
CO2: 26 mEq/L (ref 19–32)
Chloride: 105 mEq/L (ref 96–112)
Creatinine, Ser: 0.87 mg/dL (ref 0.40–1.50)
GFR: 92.65 mL/min (ref >60.00)
Glucose, Bld: 112 mg/dL — ABNORMAL HIGH (ref 70–99)
POTASSIUM: 4 meq/L (ref 3.5–5.1)
SODIUM: 138 meq/L (ref 135–145)
TOTAL PROTEIN: 7.5 g/dL (ref 6.0–8.3)
Total Bilirubin: 0.5 mg/dL (ref 0.2–1.2)

## 2018-06-11 LAB — LIPID PANEL
CHOLESTEROL: 155 mg/dL (ref 0–200)
HDL: 45.6 mg/dL (ref >39.00)
LDL Cholesterol: 97 mg/dL (ref 0–99)
NonHDL: 109.3
TRIGLYCERIDES: 60 mg/dL (ref 0.0–149.0)
Total CHOL/HDL Ratio: 3
VLDL: 12 mg/dL (ref 0.0–40.0)

## 2018-06-11 LAB — HEMOGLOBIN A1C: Hgb A1c MFr Bld: 6 % (ref 4.6–6.5)

## 2018-06-16 ENCOUNTER — Ambulatory Visit: Payer: BC Managed Care – PPO | Admitting: Family Medicine

## 2018-06-25 ENCOUNTER — Other Ambulatory Visit: Payer: Self-pay | Admitting: Family Medicine

## 2018-07-02 ENCOUNTER — Telehealth: Payer: Self-pay | Admitting: Family Medicine

## 2018-07-02 NOTE — Telephone Encounter (Signed)
Left message asking pt to call office Dr Ermalene Searing wanted pt to schedule dm follow up by [facility]

## 2018-07-08 ENCOUNTER — Other Ambulatory Visit: Payer: Self-pay | Admitting: Family Medicine

## 2018-07-08 NOTE — Telephone Encounter (Signed)
Please schedule drive up fasting lab appointment and [email or web address] with Dr. Ermalene Searing to follow up on is diabetes.  Needs appointment before I can refill his medication.

## 2018-07-08 NOTE — Telephone Encounter (Signed)
Pt state he had labs done 3/12  He did schedule doxyme appointment for tomorrow 4/9

## 2018-07-09 ENCOUNTER — Ambulatory Visit (INDEPENDENT_AMBULATORY_CARE_PROVIDER_SITE_OTHER): Payer: BC Managed Care – PPO | Admitting: Family Medicine

## 2018-07-09 ENCOUNTER — Encounter: Payer: Self-pay | Admitting: Family Medicine

## 2018-07-09 VITALS — BP 111/71 | Ht 71.5 in | Wt 352.0 lb

## 2018-07-09 DIAGNOSIS — E782 Mixed hyperlipidemia: Secondary | ICD-10-CM | POA: Diagnosis not present

## 2018-07-09 DIAGNOSIS — I10 Essential (primary) hypertension: Secondary | ICD-10-CM

## 2018-07-09 DIAGNOSIS — E039 Hypothyroidism, unspecified: Secondary | ICD-10-CM

## 2018-07-09 DIAGNOSIS — E119 Type 2 diabetes mellitus without complications: Secondary | ICD-10-CM | POA: Diagnosis not present

## 2018-07-09 DIAGNOSIS — Z1211 Encounter for screening for malignant neoplasm of colon: Secondary | ICD-10-CM

## 2018-07-09 MED ORDER — SIMVASTATIN 40 MG PO TABS: 40.0000 mg | ORAL_TABLET | Freq: Every day | ORAL | 3 refills | Status: DC

## 2018-07-09 MED ORDER — LEVOTHYROXINE SODIUM 200 MCG PO TABS: 200.0000 ug | ORAL_TABLET | Freq: Every day | ORAL | 1 refills | Status: DC

## 2018-07-09 MED ORDER — METFORMIN HCL ER 500 MG PO TB24: ORAL_TABLET | ORAL | 3 refills | Status: DC

## 2018-07-09 NOTE — Assessment & Plan Note (Signed)
Asymptomatic on current med dose. Contnue.  Will check TSH at next lab visit.

## 2018-07-09 NOTE — Progress Notes (Signed)
Marland Kitchen. VIRTUAL VISIT Due to national recommendations of social distancing due to COVID 19, a virtual visit is felt to be most appropriate for this patient at this time.   I connected with Perlie GoldHoward Schult on 07/09/18 at  8:30 AM EDT by virtual platform and verified that I am speaking with the correct person using two identifiers.   I discussed the limitations, risks, security and privacy concerns of performing an evaluation and management service by [email or web address] and the availability of in person appointments. I also discussed with the patient that there may be a patient responsible charge related to this service. The patient expressed understanding and agreed to proceed.  Patient location: Home Provider Location: Home Participants: Kaliah Haddaway Ermalene SearingBedsole and Suella GroveHoward H Priest   Chief Complaint  Patient presents with  . Diabetes    History of Present Illness:  53 year old male presents for DM follow up.  Hypertension:   Well controlled at home. BP Readings from Last 3 Encounters:  07/09/18 111/71  07/17/17 110/72  01/03/17 106/68  Using medication without problems or lightheadedness: none Chest pain with exertion:none Edema:none Short of breath:none Average home BPs: Other issues:   Diabetes:  Taking one metformin 500 mg daily an 1 glipizide daily. He has been less active.  He has not had any further low glucose feelings like. Using medications without difficulties:no SE Hypoglycemic episodes: Hyperglycemic episodes: Feet problems:no ulcers Blood Sugars averaging: not cheking eye exam within last year: Due.. cannot do in current COVID pandemic  Elevated Cholesterol: At goal on simvastatin. Goal < 100 Using medications without problems:none Muscle aches:  none Diet compliance: moderate Exercise: minimal.. going to get back to more exercise in spring and summer Other complaints:   He is due for his colonoscopy and would like to schedule out for this.  COVID 19 screen No recent travel or  known exposure to COVID19 The patient denies respiratory symptoms of COVID 19 at this time.  The importance of social distancing was discussed today.   Review of Systems  Constitutional: Negative for chills and fever.  HENT: Negative for congestion and ear pain.   Eyes: Negative for pain and redness.  Respiratory: Negative for cough and shortness of breath.   Cardiovascular: Negative for chest pain, palpitations and leg swelling.  Gastrointestinal: Negative for abdominal pain, blood in stool, constipation, diarrhea, nausea and vomiting.  Genitourinary: Negative for dysuria.  Musculoskeletal: Negative for falls and myalgias.  Skin: Negative for rash.  Neurological: Negative for dizziness.  Psychiatric/Behavioral: Negative for depression. The patient is not nervous/anxious.       Past Medical History:  Diagnosis Date  . Hyperlipidemia   . Hypertension    hospitalization 2006  . Pneumonia    hospitalization 2006    reports that he has never smoked. He has never used smokeless tobacco. He reports that he does not drink alcohol or use drugs.   Current Outpatient Medications:  .  glipiZIDE (GLUCOTROL XL) 10 MG 24 hr tablet, TAKE 1 TABLET BY MOUTH EVERY DAY WITH BREAKFAST, Disp: 90 tablet, Rfl: 0 .  levothyroxine (SYNTHROID, LEVOTHROID) 200 MCG tablet, TAKE 1 TABLET BY MOUTH EVERY DAY BEFORE BREAKFAST, Disp: 90 tablet, Rfl: 3 .  levothyroxine (SYNTHROID, LEVOTHROID) 50 MCG tablet, TAKE 1 TABLET BY MOUTH EVERY DAY, Disp: 90 tablet, Rfl: 3 .  lisinopril (PRINIVIL,ZESTRIL) 10 MG tablet, TAKE 1 TABLET BY MOUTH EVERY DAY, Disp: 90 tablet, Rfl: 0 .  metFORMIN (GLUCOPHAGE-XR) 500 MG 24 hr tablet, TAKE 1 TABLET BY MOUTH EVERY  DAY WITH BREAKFAST, Disp: 90 tablet, Rfl: 0 .  simvastatin (ZOCOR) 40 MG tablet, TAKE 1 TABLET BY MOUTH AT BEDTIME., Disp: 90 tablet, Rfl: 1   Observations/Objective: Blood pressure 111/71, height 5' 11.5" (1.816 m), weight (!) 352 lb (159.7 kg).  Physical Exam  Physical  Exam Constitutional:      General: She is not in acute distress. Pulmonary:     Effort: Pulmonary effort is normal. No respiratory distress.  Neurological:     Mental Status: She is alert and oriented to person, place, and time.  Psychiatric:        Mood and Affect: Mood normal.        Behavior: Behavior normal.   Wt Readings from Last 3 Encounters:  07/09/18 (!) 352 lb (159.7 kg)  07/17/17 (!) 337 lb 4 oz (153 kg)  01/03/17 (!) 335 lb 8 oz (152.2 kg)    Assessment and Plan Essential hypertension, benign Well controlled. Continue current medication.   Morbid obesity (HCC) He has gained 20 lbs in last year... Encouraged exercise, weight loss, healthy eating habits.   Hyperlipidemia Well controlled. Continue current medication. On statin.  Controlled type 2 diabetes mellitus without complication, without long-term current use of insulin (HCC) Resolved hypoglycemic spells on lower dose of metformin.  Control remains adequate.  Continue current regimen and work on lifestyle changes.  Hypothyroidism Asymptomatic on current med dose. Contnue.  Will check TSH at next lab visit.    I discussed the assessment and treatment plan with the patient. The patient was provided an opportunity to ask questions and all were answered. The patient agreed with the plan and demonstrated an understanding of the instructions.   The patient was advised to call back or seek an in-person evaluation if the symptoms worsen or if the condition fails to improve as anticipated.     Kerby Nora, MD

## 2018-07-09 NOTE — Assessment & Plan Note (Signed)
Well controlled. Continue current medication.  

## 2018-07-09 NOTE — Assessment & Plan Note (Signed)
Well controlled. Continue current medication. On statin. 

## 2018-07-09 NOTE — Assessment & Plan Note (Signed)
Resolved hypoglycemic spells on lower dose of metformin.  Control remains adequate.  Continue current regimen and work on lifestyle changes.

## 2018-07-09 NOTE — Assessment & Plan Note (Signed)
He has gained 20 lbs in last year... Encouraged exercise, weight loss, healthy eating habits.

## 2018-09-04 ENCOUNTER — Encounter: Payer: Self-pay | Admitting: Internal Medicine

## 2018-09-22 ENCOUNTER — Ambulatory Visit: Payer: BC Managed Care – PPO | Admitting: *Deleted

## 2018-09-22 VITALS — Ht 71.0 in | Wt 360.0 lb

## 2018-09-22 DIAGNOSIS — Z1211 Encounter for screening for malignant neoplasm of colon: Secondary | ICD-10-CM

## 2018-09-22 NOTE — Progress Notes (Signed)
Called the patient for Previsit this am. Patient's BMI exceeds the limit for [facility].Patient stating he just got back from the beach and is now attempting to lose weight. Patient requesting to call back in a couple of months for weight loss. Patient stating he is getting ready for daughter's wedding and plans to call for an appointment in the fall.

## 2018-10-04 ENCOUNTER — Other Ambulatory Visit: Payer: Self-pay | Admitting: Family Medicine

## 2018-10-06 ENCOUNTER — Encounter: Payer: BC Managed Care – PPO | Admitting: Internal Medicine

## 2018-11-15 ENCOUNTER — Other Ambulatory Visit: Payer: Self-pay | Admitting: Family Medicine

## 2018-11-20 ENCOUNTER — Other Ambulatory Visit: Payer: Self-pay | Admitting: Family Medicine

## 2018-12-27 ENCOUNTER — Other Ambulatory Visit: Payer: Self-pay | Admitting: Family Medicine

## 2018-12-30 ENCOUNTER — Other Ambulatory Visit: Payer: Self-pay | Admitting: Family Medicine

## 2019-01-08 ENCOUNTER — Telehealth: Payer: Self-pay | Admitting: Family Medicine

## 2019-01-08 DIAGNOSIS — E119 Type 2 diabetes mellitus without complications: Secondary | ICD-10-CM

## 2019-01-08 DIAGNOSIS — Z125 Encounter for screening for malignant neoplasm of prostate: Secondary | ICD-10-CM

## 2019-01-08 DIAGNOSIS — E039 Hypothyroidism, unspecified: Secondary | ICD-10-CM

## 2019-01-08 NOTE — Telephone Encounter (Signed)
-----   Message from Ellamae Sia sent at 01/04/2019 10:33 AM EDT ----- Regarding: Lab orders fro Monday, 10.12.20 Patient is scheduled for CPX labs, please order future labs, Thanks , Karna Christmas

## 2019-01-11 ENCOUNTER — Other Ambulatory Visit: Payer: BC Managed Care – PPO

## 2019-01-14 ENCOUNTER — Encounter: Payer: BC Managed Care – PPO | Admitting: Family Medicine

## 2019-02-09 ENCOUNTER — Telehealth: Payer: Self-pay | Admitting: Family Medicine

## 2019-02-10 NOTE — Telephone Encounter (Signed)
Please call and schedule CPE with fasting labs prior for Dr. Bedsole.  

## 2019-02-16 NOTE — Telephone Encounter (Signed)
Left message asking pt to call office  °

## 2019-02-18 NOTE — Telephone Encounter (Signed)
Labs 3/1 cpx 3/5

## 2019-03-03 ENCOUNTER — Other Ambulatory Visit: Payer: Self-pay | Admitting: Family Medicine

## 2019-05-28 ENCOUNTER — Telehealth: Payer: Self-pay | Admitting: Family Medicine

## 2019-05-28 DIAGNOSIS — E119 Type 2 diabetes mellitus without complications: Secondary | ICD-10-CM

## 2019-05-28 DIAGNOSIS — E782 Mixed hyperlipidemia: Secondary | ICD-10-CM

## 2019-05-28 DIAGNOSIS — Z125 Encounter for screening for malignant neoplasm of prostate: Secondary | ICD-10-CM

## 2019-05-28 DIAGNOSIS — E039 Hypothyroidism, unspecified: Secondary | ICD-10-CM

## 2019-05-28 NOTE — Telephone Encounter (Signed)
-----   Message from Aquilla Solian, RT sent at 05/19/2019  1:51 PM EST ----- Regarding: Lab Orders for Monday 3.1.2021 Please place lab orders for Monday 3.1.2021, office visit for physical on Friday 3.5.2021 Thank you, Jones Bales RT(R)

## 2019-05-31 ENCOUNTER — Other Ambulatory Visit: Payer: Self-pay

## 2019-05-31 ENCOUNTER — Other Ambulatory Visit (INDEPENDENT_AMBULATORY_CARE_PROVIDER_SITE_OTHER): Payer: BC Managed Care – PPO

## 2019-05-31 DIAGNOSIS — E782 Mixed hyperlipidemia: Secondary | ICD-10-CM | POA: Diagnosis not present

## 2019-05-31 DIAGNOSIS — E119 Type 2 diabetes mellitus without complications: Secondary | ICD-10-CM

## 2019-05-31 DIAGNOSIS — Z125 Encounter for screening for malignant neoplasm of prostate: Secondary | ICD-10-CM

## 2019-05-31 DIAGNOSIS — E039 Hypothyroidism, unspecified: Secondary | ICD-10-CM | POA: Diagnosis not present

## 2019-05-31 LAB — LIPID PANEL
Cholesterol: 144 mg/dL (ref 0–200)
HDL: 37.1 mg/dL — ABNORMAL LOW (ref >39.00)
LDL Cholesterol: 93 mg/dL (ref 0–99)
NonHDL: 107.38
Total CHOL/HDL Ratio: 4
Triglycerides: 72 mg/dL (ref 0.0–149.0)
VLDL: 14.4 mg/dL (ref 0.0–40.0)

## 2019-05-31 LAB — HEMOGLOBIN A1C: Hgb A1c MFr Bld: 6.4 % (ref 4.6–6.5)

## 2019-05-31 LAB — COMPREHENSIVE METABOLIC PANEL
ALT: 16 U/L (ref 0–53)
AST: 16 U/L (ref 0–37)
Albumin: 3.9 g/dL (ref 3.5–5.2)
Alkaline Phosphatase: 45 U/L (ref 39–117)
BUN: 13 mg/dL (ref 6–23)
CO2: 29 mEq/L (ref 19–32)
Calcium: 9.3 mg/dL (ref 8.4–10.5)
Chloride: 102 mEq/L (ref 96–112)
Creatinine, Ser: 0.86 mg/dL (ref 0.40–1.50)
GFR: 93.53 mL/min (ref >60.00)
Glucose, Bld: 129 mg/dL — ABNORMAL HIGH (ref 70–99)
Potassium: 4.6 mEq/L (ref 3.5–5.1)
Sodium: 138 mEq/L (ref 135–145)
Total Bilirubin: 0.7 mg/dL (ref 0.2–1.2)
Total Protein: 7.3 g/dL (ref 6.0–8.3)

## 2019-05-31 LAB — T4, FREE: Free T4: 1.07 ng/dL (ref 0.60–1.60)

## 2019-05-31 LAB — T3, FREE: T3, Free: 3.3 pg/mL (ref 2.3–4.2)

## 2019-05-31 LAB — PSA: PSA: 0.19 ng/mL (ref 0.10–4.00)

## 2019-05-31 LAB — TSH: TSH: 3.79 u[IU]/mL (ref 0.35–4.50)

## 2019-05-31 NOTE — Progress Notes (Signed)
No critical labs need to be addressed urgently. We will discuss labs in detail at upcoming office visit.   

## 2019-06-03 ENCOUNTER — Other Ambulatory Visit: Payer: Self-pay | Admitting: Family Medicine

## 2019-06-04 ENCOUNTER — Ambulatory Visit (INDEPENDENT_AMBULATORY_CARE_PROVIDER_SITE_OTHER): Payer: BC Managed Care – PPO | Admitting: Family Medicine

## 2019-06-04 ENCOUNTER — Other Ambulatory Visit: Payer: Self-pay

## 2019-06-04 ENCOUNTER — Encounter: Payer: Self-pay | Admitting: Family Medicine

## 2019-06-04 VITALS — BP 113/69 | HR 71 | Ht 71.0 in | Wt 361.0 lb

## 2019-06-04 DIAGNOSIS — Z0000 Encounter for general adult medical examination without abnormal findings: Secondary | ICD-10-CM

## 2019-06-04 DIAGNOSIS — I10 Essential (primary) hypertension: Secondary | ICD-10-CM

## 2019-06-04 DIAGNOSIS — E119 Type 2 diabetes mellitus without complications: Secondary | ICD-10-CM

## 2019-06-04 DIAGNOSIS — E039 Hypothyroidism, unspecified: Secondary | ICD-10-CM | POA: Diagnosis not present

## 2019-06-04 DIAGNOSIS — E1159 Type 2 diabetes mellitus with other circulatory complications: Secondary | ICD-10-CM

## 2019-06-04 DIAGNOSIS — I152 Hypertension secondary to endocrine disorders: Secondary | ICD-10-CM

## 2019-06-04 NOTE — Progress Notes (Signed)
Chief Complaint  Patient presents with  . Annual Exam    History of Present Illness: HPI  The patient is here for annual wellness exam and preventative care.    Diabetes:   Good control on metformin and glipizide Lab Results  Component Value Date   HGBA1C 6.4 05/31/2019  Using medications without difficulties: Hypoglycemic episodes: Hyperglycemic episodes:  Feet problems: no ulcers Blood Sugars averaging: none eye exam within last year: due .. scheduled  Hypertension:    At goal on lisinopril ACEI. BP Readings from Last 3 Encounters:  06/04/19 113/69  07/09/18 111/71  07/17/17 110/72  Using medication without problems or lightheadedness:  none Chest pain with exertion:none Edema:none Short of breath:none Average home BPs: good control Other issues:  Elevated Cholesterol:  LDL at goal < 100 on simvastatin Lab Results  Component Value Date   CHOL 144 05/31/2019   HDL 37.10 (L) 05/31/2019   LDLCALC 93 05/31/2019   LDLDIRECT 150.4 05/19/2007   TRIG 72.0 05/31/2019   CHOLHDL 4 05/31/2019  Using medications without problems: Muscle aches:  Diet compliance: moderate Exercise: walking some Other complaints:   Hypothyroid  Stable control on levothyroxine 200 mcg  And 50 mcg daily Lab Results  Component Value Date   TSH 3.79 05/31/2019   Wt Readings from Last 3 Encounters:  06/04/19 (!) 361 lb (163.7 kg)  09/22/18 (!) 360 lb (163.3 kg)  07/09/18 (!) 352 lb (159.7 kg)      This visit occurred during the SARS-CoV-2 public health emergency.  Safety protocols were in place, including screening questions prior to the visit, additional usage of staff PPE, and extensive cleaning of exam room while observing appropriate contact time as indicated for disinfecting solutions.   COVID 19 screen:  No recent travel or known exposure to COVID19 The patient denies respiratory symptoms of COVID 19 at this time. The importance of social distancing was discussed today.      Review of Systems  Constitutional: Negative for chills and fever.  HENT: Negative for congestion and ear pain.   Eyes: Negative for pain and redness.  Respiratory: Negative for cough and shortness of breath.   Cardiovascular: Negative for chest pain, palpitations and leg swelling.  Gastrointestinal: Negative for abdominal pain, blood in stool, constipation, diarrhea, nausea and vomiting.  Genitourinary: Negative for dysuria.  Musculoskeletal: Negative for falls and myalgias.  Skin: Negative for rash.  Neurological: Negative for dizziness.  Psychiatric/Behavioral: Negative for depression. The patient is not nervous/anxious.       Past Medical History:  Diagnosis Date  . Hyperlipidemia   . Hypertension    hospitalization 2006  . Pneumonia    hospitalization 2006    reports that he has never smoked. He has never used smokeless tobacco. He reports that he does not drink alcohol or use drugs.   Current Outpatient Medications:  .  glipiZIDE (GLUCOTROL XL) 10 MG 24 hr tablet, TAKE 1 TABLET BY MOUTH EVERY DAY WITH BREAKFAST, Disp: 90 tablet, Rfl: 0 .  levothyroxine (SYNTHROID) 200 MCG tablet, TAKE 1 TABLET (200 MCG TOTAL) BY MOUTH DAILY BEFORE BREAKFAST., Disp: 90 tablet, Rfl: 3 .  levothyroxine (SYNTHROID) 50 MCG tablet, TAKE 1 TABLET BY MOUTH EVERY DAY, Disp: 90 tablet, Rfl: 3 .  lisinopril (ZESTRIL) 10 MG tablet, TAKE 1 TABLET BY MOUTH EVERY DAY, Disp: 90 tablet, Rfl: 0 .  metFORMIN (GLUCOPHAGE-XR) 500 MG 24 hr tablet, TAKE 1 TABLET BY MOUTH EVERY DAY WITH BREAKFAST, Disp: 90 tablet, Rfl: 3 .  simvastatin (  ZOCOR) 40 MG tablet, Take 1 tablet (40 mg total) by mouth at bedtime., Disp: 90 tablet, Rfl: 3   Observations/Objective: Blood pressure 113/69, pulse 71, height 5\' 11"  (1.803 m), weight (!) 361 lb (163.7 kg).  Physical Exam Constitutional:      General: He is not in acute distress.    Appearance: Normal appearance. He is well-developed. He is obese. He is not ill-appearing  or toxic-appearing.  HENT:     Head: Normocephalic and atraumatic.     Right Ear: Hearing, tympanic membrane, ear canal and external ear normal.     Left Ear: Hearing, tympanic membrane, ear canal and external ear normal.     Nose: Nose normal.     Mouth/Throat:     Pharynx: Uvula midline.  Eyes:     General: Lids are normal. Lids are everted, no foreign bodies appreciated.     Conjunctiva/sclera: Conjunctivae normal.     Pupils: Pupils are equal, round, and reactive to light.  Neck:     Thyroid: No thyroid mass or thyromegaly.     Vascular: No carotid bruit.     Trachea: Trachea and phonation normal.  Cardiovascular:     Rate and Rhythm: Normal rate and regular rhythm.     Pulses: Normal pulses.     Heart sounds: S1 normal and S2 normal. No murmur. No gallop.   Pulmonary:     Breath sounds: Normal breath sounds. No wheezing, rhonchi or rales.  Abdominal:     General: Bowel sounds are normal.     Palpations: Abdomen is soft.     Tenderness: There is no abdominal tenderness. There is no guarding or rebound.     Hernia: No hernia is present.  Musculoskeletal:     Cervical back: Normal range of motion and neck supple.  Lymphadenopathy:     Cervical: No cervical adenopathy.  Skin:    General: Skin is warm and dry.     Findings: No rash.  Neurological:     Mental Status: He is alert.     Cranial Nerves: No cranial nerve deficit.     Sensory: No sensory deficit.     Gait: Gait normal.     Deep Tendon Reflexes: Reflexes are normal and symmetric.  Psychiatric:        Speech: Speech normal.        Behavior: Behavior normal.        Judgment: Judgment normal.     Diabetic foot exam: Normal inspection No skin breakdown No calluses  Normal DP pulses Normal sensation to light touch and monofilament Nails normal   Assessment and Plan The patient's preventative maintenance and recommended screening tests for an annual wellness exam were reviewed in full today. Brought up to  date unless services declined.  Counselled on the importance of diet, exercise, and its role in overall health and mortality. The patient's FH and SH was reviewed, including their home life, tobacco status, and drug and alcohol status.      Vaccines: update Prostate Cancer Screen: no early family history  Lab Results  Component Value Date   PSA 0.19 05/31/2019     Colon Cancer Screen:  no early family history, plan colonoscopy with Dr. Hilarie Fredrickson      Smoking Status: none ETOH/ drug use: none/none HIV screen:  Refused.   Hypertension associated with diabetes ([place]) Well controlled. Continue current medication.   Morbid obesity ([place]) Encouraged exercise, weight loss, healthy eating habits.   Controlled type 2 diabetes mellitus  without complication, without long-term current use of insulin (HCC)  Good control on metformin and glipizide.  Hypothyroidism Stable control on levothyroxine      Kerby Nora, MD

## 2019-06-04 NOTE — Assessment & Plan Note (Signed)
Well controlled. Continue current medication.  

## 2019-06-07 LAB — HM DIABETES FOOT EXAM

## 2019-06-17 LAB — HM DIABETES EYE EXAM

## 2019-06-19 NOTE — Patient Instructions (Signed)
Set up yearly eye exam. 

## 2019-06-19 NOTE — Assessment & Plan Note (Signed)
Good control on metformin and glipizide.

## 2019-06-19 NOTE — Assessment & Plan Note (Signed)
Encouraged exercise, weight loss, healthy eating habits. ? ?

## 2019-06-19 NOTE — Assessment & Plan Note (Signed)
Stable control on levothyroxine. 

## 2019-06-21 ENCOUNTER — Encounter: Payer: Self-pay | Admitting: Family Medicine

## 2019-07-26 ENCOUNTER — Other Ambulatory Visit: Payer: Self-pay | Admitting: Family Medicine

## 2019-09-14 ENCOUNTER — Encounter: Payer: Self-pay | Admitting: Family Medicine

## 2019-09-14 ENCOUNTER — Ambulatory Visit: Payer: BC Managed Care – PPO | Admitting: Family Medicine

## 2019-09-14 ENCOUNTER — Other Ambulatory Visit: Payer: Self-pay

## 2019-09-14 VITALS — BP 112/74 | HR 73 | Temp 97.5°F | Ht 71.0 in | Wt 378.0 lb

## 2019-09-14 DIAGNOSIS — R21 Rash and other nonspecific skin eruption: Secondary | ICD-10-CM | POA: Insufficient documentation

## 2019-09-14 MED ORDER — PREDNISONE 20 MG PO TABS: ORAL_TABLET | ORAL | 0 refills | Status: DC

## 2019-09-14 NOTE — Assessment & Plan Note (Signed)
UNclear cause.[name] allergic reaction.  No red flags, no angioedema.  hold ACEI temporarily.  Treat with oral steroid course and antihistamine.  Go to ER if SOb or oral swelling.

## 2019-09-14 NOTE — Progress Notes (Signed)
Chief Complaint  Patient presents with  . Neck Swelling    now moved to chest area    History of Present Illness: HPI 54 year old male presents with new onset swelling and redness in posterior neck, warmth 4 days ago.  Swelling then moved forward to anterior neck and to upper chest.  Now neck is fine but  Now rash on upper chest.   No pain, mildly itchy.  no chest pain. No SOB, no tounge or lip swelling.  No issue swallowing. No fatigue.   No new  exposures, foods, no bites.    Recent Labs       Lab Results  Component Value Date   HGBA1C 6.4 05/31/2019       Started benadryl.  He is on lisinopril ACEI    This visit occurred during the SARS-CoV-2 public health emergency. Safety protocols were in place, including screening questions prior to the visit, additional usage of staff PPE, and extensive cleaning of exam room while observing appropriate contact time as indicated for disinfecting solutions.   COVID 19 screen:  No recent travel or known exposure to COVID19 The patient denies respiratory symptoms of COVID 19 at this time. The importance of social distancing was discussed today.     ROS negative toherwise      Past Medical History:  Diagnosis Date  . Hyperlipidemia   . Hypertension    hospitalization 2006  . Pneumonia    hospitalization 2006    reports that he has never smoked. He has never used smokeless tobacco. He reports that he does not drink alcohol and does not use drugs.   Current Outpatient Medications:  .  glipiZIDE (GLUCOTROL XL) 10 MG 24 hr tablet, TAKE 1 TABLET BY MOUTH EVERY DAY WITH BREAKFAST, Disp: 90 tablet, Rfl: 0 .  levothyroxine (SYNTHROID) 200 MCG tablet, TAKE 1 TABLET (200 MCG TOTAL) BY MOUTH DAILY BEFORE BREAKFAST., Disp: 90 tablet, Rfl: 3 .  levothyroxine (SYNTHROID) 50 MCG tablet, TAKE 1 TABLET BY MOUTH EVERY DAY, Disp: 90 tablet, Rfl: 3 .  lisinopril (ZESTRIL) 10 MG tablet, TAKE 1 TABLET BY MOUTH  EVERY DAY, Disp: 90 tablet, Rfl: 0 .  metFORMIN (GLUCOPHAGE-XR) 500 MG 24 hr tablet, TAKE 1 TABLET BY MOUTH EVERY DAY WITH BREAKFAST, Disp: 90 tablet, Rfl: 3 .  simvastatin (ZOCOR) 40 MG tablet, TAKE 1 TABLET BY MOUTH EVERYDAY AT BEDTIME, Disp: 90 tablet, Rfl: 3   Observations/Objective: Pulse 73, temperature (!) 97.5 F (36.4 C), temperature source Temporal, height 5\' 11"  (1.803 m), weight (!) 378 lb (171.5 kg), SpO2 97 %.  Physical Exam  Physical Exam Constitutional:      Appearance: He is well-developed.  HENT:     Head: Normocephalic.     Right Ear: Hearing normal.     Left Ear: Hearing normal.     Nose: Nose normal.  Neck:     Thyroid: No thyroid mass or thyromegaly.     Vascular: No carotid bruit.     Trachea: Trachea normal.  Cardiovascular:     Rate and Rhythm: Normal rate and regular rhythm.     Pulses: Normal pulses.     Heart sounds: Heart sounds not distant. No murmur heard.  No friction rub. No gallop.      Comments: No peripheral edema Pulmonary:     Effort: Pulmonary effort is normal. No respiratory distress.     Breath sounds: Normal breath sounds.  Skin:    General: Skin is warm and dry.  Findings: No rash.     Comments: Dry skin on posterior neck, scabs from scratching  Erythema and warmth diffuse on anterior central chest  Psychiatric:        Speech: Speech normal.        Behavior: Behavior normal.        Thought Content: Thought content normal.     Assessment and Plan  Rash UNclear cause.Shaune Leeks allergic reaction.  No red flags, no angioedema.  hold ACEI temporarily.  Treat with oral steroid course and antihistamine.  Go to ER if SOb or oral swelling.     Eliezer Lofts, MD

## 2019-09-14 NOTE — Patient Instructions (Signed)
Stop benadryl.. change to  Claritin daily.  Complete prednisone taper.  Hold lisinopril for next 3 days, then restart.  Call if not improving.Marland Kitchen go to ER if shortness of breath.

## 2019-10-18 ENCOUNTER — Other Ambulatory Visit: Payer: Self-pay | Admitting: Family Medicine

## 2020-05-24 ENCOUNTER — Other Ambulatory Visit: Payer: Self-pay | Admitting: Family Medicine

## 2020-05-25 NOTE — Telephone Encounter (Signed)
Please schedule CPE with fasting labs for Dr. Ermalene Searing for sometime after 06/13/20.

## 2020-05-25 NOTE — Telephone Encounter (Signed)
Left voice message to call the office  

## 2020-05-31 NOTE — Telephone Encounter (Signed)
Left voice message to call the office  

## 2020-06-07 NOTE — Telephone Encounter (Signed)
Left voice message to call th office . Letter sent

## 2020-07-18 ENCOUNTER — Other Ambulatory Visit: Payer: Self-pay | Admitting: Family Medicine

## 2020-07-18 NOTE — Telephone Encounter (Signed)
Please schedule CPE with fasting labs prior for Dr. Bedsole. 

## 2020-07-18 NOTE — Telephone Encounter (Signed)
Left voicemail to call the office.

## 2020-07-26 NOTE — Telephone Encounter (Signed)
Left voice message to call the office  

## 2020-08-04 NOTE — Telephone Encounter (Signed)
Left voice message to call the office . Letter sent  

## 2020-08-22 ENCOUNTER — Other Ambulatory Visit: Payer: Self-pay | Admitting: Family Medicine

## 2020-08-22 NOTE — Telephone Encounter (Signed)
Please call and schedule CPE with fasting labs prior for Dr. Ermalene Searing.  Once sheduled, please send back to me to refill his medication.

## 2020-08-22 NOTE — Telephone Encounter (Signed)
Left voice message for patient to call the office  

## 2020-08-29 NOTE — Telephone Encounter (Signed)
Call patient no answer

## 2020-09-05 NOTE — Telephone Encounter (Signed)
Left voice message to call the office  

## 2020-09-19 NOTE — Telephone Encounter (Signed)
Refills called into [facility] [address]

## 2020-09-19 NOTE — Telephone Encounter (Signed)
Spoke with patient scheduled CPE with fasting labs 

## 2020-10-12 ENCOUNTER — Other Ambulatory Visit: Payer: Self-pay | Admitting: Family Medicine

## 2020-10-14 ENCOUNTER — Other Ambulatory Visit: Payer: Self-pay | Admitting: Family Medicine

## 2020-11-10 ENCOUNTER — Other Ambulatory Visit: Payer: Self-pay

## 2020-11-10 ENCOUNTER — Other Ambulatory Visit (INDEPENDENT_AMBULATORY_CARE_PROVIDER_SITE_OTHER): Payer: BC Managed Care – PPO

## 2020-11-10 ENCOUNTER — Telehealth: Payer: Self-pay | Admitting: Family Medicine

## 2020-11-10 DIAGNOSIS — E119 Type 2 diabetes mellitus without complications: Secondary | ICD-10-CM

## 2020-11-10 DIAGNOSIS — E039 Hypothyroidism, unspecified: Secondary | ICD-10-CM

## 2020-11-10 DIAGNOSIS — Z125 Encounter for screening for malignant neoplasm of prostate: Secondary | ICD-10-CM

## 2020-11-10 LAB — COMPREHENSIVE METABOLIC PANEL
ALT: 35 U/L (ref 0–53)
AST: 30 U/L (ref 0–37)
Albumin: 4 g/dL (ref 3.5–5.2)
Alkaline Phosphatase: 52 U/L (ref 39–117)
BUN: 10 mg/dL (ref 6–23)
CO2: 22 mEq/L (ref 19–32)
Calcium: 9.4 mg/dL (ref 8.4–10.5)
Chloride: 102 mEq/L (ref 96–112)
Creatinine, Ser: 0.85 mg/dL (ref 0.40–1.50)
GFR: 99.52 mL/min (ref >60.00)
Glucose, Bld: 147 mg/dL — ABNORMAL HIGH (ref 70–99)
Potassium: 4.4 mEq/L (ref 3.5–5.1)
Sodium: 137 mEq/L (ref 135–145)
Total Bilirubin: 0.6 mg/dL (ref 0.2–1.2)
Total Protein: 7.4 g/dL (ref 6.0–8.3)

## 2020-11-10 LAB — LIPID PANEL
Cholesterol: 198 mg/dL (ref 0–200)
HDL: 46.3 mg/dL (ref >39.00)
LDL Cholesterol: 131 mg/dL — ABNORMAL HIGH (ref 0–99)
NonHDL: 151.58
Total CHOL/HDL Ratio: 4
Triglycerides: 105 mg/dL (ref 0.0–149.0)
VLDL: 21 mg/dL (ref 0.0–40.0)

## 2020-11-10 LAB — TSH: TSH: 12.62 u[IU]/mL — ABNORMAL HIGH (ref 0.35–5.50)

## 2020-11-10 LAB — PSA: PSA: 0.24 ng/mL (ref 0.10–4.00)

## 2020-11-10 LAB — HEMOGLOBIN A1C: Hgb A1c MFr Bld: 7.5 % — ABNORMAL HIGH (ref 4.6–6.5)

## 2020-11-10 LAB — T4, FREE: Free T4: 0.86 ng/dL (ref 0.60–1.60)

## 2020-11-10 LAB — T3, FREE: T3, Free: 3.2 pg/mL (ref 2.3–4.2)

## 2020-11-10 NOTE — Telephone Encounter (Signed)
-----   Message from Alvina Chou sent at 10/23/2020  9:31 AM EDT ----- Regarding: Lab orders for Friday, 8.12.22 Patient is scheduled for CPX labs, please order future labs, Thanks , Camelia Eng

## 2020-11-10 NOTE — Progress Notes (Signed)
No critical labs need to be addressed urgently. We will discuss labs in detail at upcoming office visit.   

## 2020-11-17 ENCOUNTER — Encounter: Payer: Self-pay | Admitting: Family Medicine

## 2020-11-17 ENCOUNTER — Telehealth (INDEPENDENT_AMBULATORY_CARE_PROVIDER_SITE_OTHER): Payer: BC Managed Care – PPO | Admitting: Family Medicine

## 2020-11-17 VITALS — BP 116/69 | Wt 371.0 lb

## 2020-11-17 DIAGNOSIS — E039 Hypothyroidism, unspecified: Secondary | ICD-10-CM | POA: Diagnosis not present

## 2020-11-17 DIAGNOSIS — E1159 Type 2 diabetes mellitus with other circulatory complications: Secondary | ICD-10-CM | POA: Diagnosis not present

## 2020-11-17 DIAGNOSIS — E785 Hyperlipidemia, unspecified: Secondary | ICD-10-CM

## 2020-11-17 DIAGNOSIS — E1169 Type 2 diabetes mellitus with other specified complication: Secondary | ICD-10-CM

## 2020-11-17 DIAGNOSIS — I152 Hypertension secondary to endocrine disorders: Secondary | ICD-10-CM

## 2020-11-17 DIAGNOSIS — E119 Type 2 diabetes mellitus without complications: Secondary | ICD-10-CM

## 2020-11-17 NOTE — Progress Notes (Signed)
VIRTUAL VISIT Due to national recommendations of social distancing due to COVID 19, a virtual visit is felt to be most appropriate for this patient at this time.   I connected with the patient on 11/17/20 at  9:20 AM EDT by virtual telehealth platform and verified that I am speaking with the correct person using two identifiers.   I discussed the limitations, risks, security and privacy concerns of performing an evaluation and management service by  virtual telehealth platform and the availability of in person appointments. I also discussed with the patient that there may be a patient responsible charge related to this service. The patient expressed understanding and agreed to proceed.  Patient location: Home Provider Location: [place] [facility] Participants: Kerby Nora and Suella Grove   Chief Complaint  Patient presents with   Discuss Lab Results    History of Present Illness:  55 year old male presents for follow up.  Diabetes:  worsened control on glipizide 10 mg XL and metfomrin  xr  500 mg daily  He has not been eating well over summer given vacationing. Lab Results  Component Value Date   HGBA1C 7.5 (H) 11/10/2020  Using medications without difficulties: Hypoglycemic episodes: Hyperglycemic episodes: Feet problems:none Blood Sugars averaging:not checking. eye exam within last year:setting this up at [facility]    Hypothyroid   t3 free and free t4 normal on levothyroxine 250 mcg daily Lab Results  Component Value Date   TSH 12.62 (H) 11/10/2020   Elevated Cholesterol:  LDL not at goal on simvastatin  40 mg Lab Results  Component Value Date   CHOL 198 11/10/2020   HDL 46.30 11/10/2020   LDLCALC 131 (H) 11/10/2020   LDLDIRECT 150.4 05/19/2007   TRIG 105.0 11/10/2020   CHOLHDL 4 11/10/2020  Using medications without problems: Muscle aches:  Diet compliance: moderate Exercise: active in yard, farming Other complaints:he has lost 7 lbs but gained some  back Wt Readings from Last 3 Encounters:  11/17/20 (!) 371 lb (168.3 kg)  09/14/19 (!) 378 lb (171.5 kg)  06/04/19 (!) 361 lb (163.7 kg)    Hypertension: BP at goal on ACEI BP Readings from Last 3 Encounters:  11/17/20 116/69  09/14/19 112/74  06/04/19 113/69  Using medication without problems or lightheadedness: none Chest pain with exertion:none Edema:none Short of breath: none Average home BPs: Other issues:  COVID 19 screen No recent travel or known exposure to COVID19 Th day.   Review of Systems  Constitutional:  Negative for chills and fever.  HENT:  Negative for congestion and ear pain.   Eyes:  Negative for pain and redness.  Respiratory:  Negative for cough and shortness of breath.   Cardiovascular:  Negative for chest pain, palpitations and leg swelling.  Gastrointestinal:  Negative for abdominal pain, blood in stool, constipation, diarrhea, nausea and vomiting.  Genitourinary:  Negative for dysuria.  Musculoskeletal:  Negative for falls and myalgias.  Skin:  Negative for rash.  Neurological:  Negative for dizziness.  Psychiatric/Behavioral:  Negative for depression. The patient is not nervous/anxious.      Past Medical History:  Diagnosis Date   Hyperlipidemia    Hypertension    hospitalization 2006   Pneumonia    hospitalization 2006    reports that he has never smoked. He has never used smokeless tobacco. He reports that he does not drink alcohol and does not use drugs.   Current Outpatient Medications:    glipiZIDE (GLUCOTROL XL) 10 MG 24 hr tablet, TAKE  1 TABLET BY MOUTH EVERY DAY WITH BREAKFAST, Disp: 90 tablet, Rfl: 0   levothyroxine (SYNTHROID) 200 MCG tablet, TAKE 1 TABLET (200 MCG TOTAL) BY MOUTH DAILY BEFORE BREAKFAST., Disp: 90 tablet, Rfl: 0   levothyroxine (SYNTHROID) 50 MCG tablet, TAKE 1 TABLET BY MOUTH EVERY DAY, Disp: 90 tablet, Rfl: 0   lisinopril (ZESTRIL) 10 MG tablet, TAKE 1 TABLET BY MOUTH EVERY DAY, Disp: 90 tablet, Rfl: 0    metFORMIN (GLUCOPHAGE-XR) 500 MG 24 hr tablet, TAKE 1 TABLET BY MOUTH EVERY DAY WITH BREAKFAST, Disp: 90 tablet, Rfl: 0   simvastatin (ZOCOR) 40 MG tablet, TAKE 1 TABLET BY MOUTH EVERYDAY AT BEDTIME, Disp: 90 tablet, Rfl: 0   Observations/Objective: Blood pressure 116/69, weight (!) 371 lb (168.3 kg).  Physical Exam  Physical Exam Constitutional:      General: The patient is not in acute distress. Pulmonary:     Effort: Pulmonary effort is normal. No respiratory distress.  Neurological:     Mental Status: The patient is alert and oriented to person, place, and time.  Psychiatric:        Mood and Affect: Mood normal.        Behavior: Behavior normal.   Assessment and Plan Problem List Items Addressed This Visit     Controlled type 2 diabetes mellitus without complication, without long-term current use of insulin (HCC) - Primary    Previously well controlled on glipizide and metformin.  Get back on track with healthy eating, regualr exercise and weight loss.  re-eval in 3 months.        Hyperlipidemia associated with type 2 diabetes mellitus (HCC)    Inadequate control, chronic.  LD not at goal < 100 despite simvastain 40 mg daily.       Hypertension associated with diabetes (HCC)    Stable, chronic.  Continue current medication.   Lisinopril 10 mg daily      Hypothyroidism    Stable, chronic.  Continue current medication.   Levothyroxine 250 mcg daily.      Morbid obesity (HCC)    Encouraged exercise, weight loss, healthy eating habits. Recommended medication  GLP1i or bariatric surgery given comorbidities.         I discussed the assessment and treatment plan with the patient. The patient was provided an opportunity to ask questions and all were answered. The patient agreed with the plan and demonstrated an understanding of the instructions.   The patient was advised to call back or seek an in-person evaluation if the symptoms worsen or if the condition fails  to improve as anticipated.     Kerby Nora, MD

## 2020-11-17 NOTE — Patient Instructions (Addendum)
Call  for  appt in 3 months for  CPX, diabetes follow up with fasting labs prior. Set up yearly eye exam for diabetes and have the opthalmologist send [place] a copy of the evaluation for the chart.

## 2020-11-17 NOTE — Assessment & Plan Note (Signed)
Inadequate control, chronic.  LD not at goal < 100 despite simvastain 40 mg daily.

## 2020-11-17 NOTE — Assessment & Plan Note (Signed)
Stable, chronic.  Continue current medication.   Lisinopril 10 mg daily   

## 2020-11-17 NOTE — Assessment & Plan Note (Signed)
Encouraged exercise, weight loss, healthy eating habits. Recommended medication  GLP1i or bariatric surgery given comorbidities.

## 2020-11-17 NOTE — Assessment & Plan Note (Signed)
Stable, chronic.  Continue current medication.   Levothyroxine 250 mcg daily.

## 2020-11-17 NOTE — Assessment & Plan Note (Addendum)
Previously well controlled on glipizide and metformin.  Get back on track with healthy eating, regualr exercise and weight loss.  re-eval in 3 months.

## 2020-12-20 ENCOUNTER — Other Ambulatory Visit: Payer: Self-pay | Admitting: Family Medicine

## 2020-12-21 ENCOUNTER — Other Ambulatory Visit: Payer: Self-pay | Admitting: Family Medicine

## 2020-12-21 NOTE — Telephone Encounter (Signed)
Lvm and my chart for pt to call and schedule a cpe/lab

## 2020-12-21 NOTE — Telephone Encounter (Signed)
Please schedule CPE with fasting labs prior if possible for sometime around 02/17/2021  with Dr. Ermalene Searing.

## 2020-12-22 NOTE — Telephone Encounter (Signed)
2nd attempt  LMTCB to schedule appts  

## 2021-01-12 ENCOUNTER — Other Ambulatory Visit: Payer: Self-pay | Admitting: Family Medicine

## 2021-01-13 ENCOUNTER — Other Ambulatory Visit: Payer: Self-pay | Admitting: Family Medicine

## 2021-03-17 ENCOUNTER — Other Ambulatory Visit: Payer: Self-pay | Admitting: Family Medicine

## 2021-03-19 NOTE — Telephone Encounter (Signed)
Please call and schedule CPE with fasting labs prior with Dr. Bedsole. 

## 2021-03-19 NOTE — Telephone Encounter (Signed)
Lvm for pt to schedule a lab/ape and also sent mychart letter

## 2021-03-20 NOTE — Telephone Encounter (Signed)
2nd attempt to call pt to schedule lab/cpe

## 2021-03-22 ENCOUNTER — Other Ambulatory Visit: Payer: Self-pay | Admitting: Family Medicine

## 2021-04-25 ENCOUNTER — Other Ambulatory Visit: Payer: Self-pay | Admitting: Family Medicine

## 2021-06-27 ENCOUNTER — Other Ambulatory Visit: Payer: Self-pay | Admitting: Family Medicine

## 2021-06-27 NOTE — Telephone Encounter (Signed)
Please call and schedule CPE with fasting labs prior for Dr. Bedsole.  

## 2021-07-03 ENCOUNTER — Other Ambulatory Visit: Payer: Self-pay | Admitting: Family Medicine

## 2021-07-03 NOTE — Telephone Encounter (Signed)
Left message for [name] to call the office and schedule CPE with fasting labs prior with Dr. Ermalene Searing.  ?

## 2021-07-25 ENCOUNTER — Other Ambulatory Visit: Payer: Self-pay | Admitting: Family Medicine

## 2021-07-25 NOTE — Telephone Encounter (Signed)
Last office visit 11/17/2020 (virtual) for DM.  Last refilled 07/03/21 for #30 with no refills.  I left message for Mr. Swider on 07/03/21 to call office and schedule CPE.  Last AVS states:  Call  for  appt in 3 months for  CPX, diabetes follow up with fasting labs prior. ?No future appointments.  Refill? ?

## 2021-07-27 ENCOUNTER — Other Ambulatory Visit: Payer: Self-pay | Admitting: Family Medicine

## 2021-08-06 LAB — HM DIABETES FOOT EXAM

## 2021-08-09 ENCOUNTER — Ambulatory Visit: Payer: BC Managed Care – PPO | Admitting: Family Medicine

## 2021-08-09 ENCOUNTER — Encounter: Payer: Self-pay | Admitting: Family Medicine

## 2021-08-09 VITALS — BP 132/80 | HR 70 | Ht 72.0 in | Wt 398.0 lb

## 2021-08-09 DIAGNOSIS — E1159 Type 2 diabetes mellitus with other circulatory complications: Secondary | ICD-10-CM

## 2021-08-09 DIAGNOSIS — E119 Type 2 diabetes mellitus without complications: Secondary | ICD-10-CM

## 2021-08-09 DIAGNOSIS — I10 Essential (primary) hypertension: Secondary | ICD-10-CM

## 2021-08-09 LAB — POCT GLYCOSYLATED HEMOGLOBIN (HGB A1C): Hemoglobin A1C: 8.2 % — AB (ref 4.0–5.6)

## 2021-08-09 MED ORDER — OZEMPIC (0.25 OR 0.5 MG/DOSE) 2 MG/1.5ML ~~LOC~~ SOPN: 0.2500 mg | PEN_INJECTOR | SUBCUTANEOUS | 11 refills | Status: DC

## 2021-08-09 MED ORDER — BLOOD GLUCOSE MONITOR KIT: PACK | 0 refills | Status: AC

## 2021-08-09 NOTE — Assessment & Plan Note (Addendum)
Chronic, worsening control ? ?Associated with multiple comorbidity including sleep apnea diabetes, hypertension and high cholesterol. ?Start Ozempic 0.25 mg weekly for weight management.  We will continue other lifestyle changes as noted ?

## 2021-08-09 NOTE — Progress Notes (Signed)
? ? Patient ID: [name], male    DOB: Oct 09, 1965, 56 y.o.   MRN: [id number] ? ?This visit was conducted in person. ? ?BP 132/80   Pulse 70   Ht 6' (1.829 m)   Wt (!) 398 lb (180.5 kg)   SpO2 96%   BMI 53.98 kg/m?   ? ?CC:  ?Chief Complaint  ?Patient presents with  ? Follow-up  ?  Follow up  for diabetes , discuss weight loss injection  , pt hasn't tried any weight loss  medication in the past.  ? ? ?Subjective:  ? ?HPI: ?[name] is a 56 y.o. male presenting on 08/09/2021 for Follow-up (Follow up  for diabetes , discuss weight loss injection  , pt hasn't tried any weight loss  medication in the past.) ? ? ?Diabetes:   Poor, worsening control of diabetes despite glipizide xl 10 mg daily, metformin 500 mg daily ?Lab Results  ?Component Value Date  ? HGBA1C 8.2 (A) 08/09/2021  ?Using medications without difficulties: ?Hypoglycemic episodes: ?Hyperglycemic episodes: ?Feet problems: no ulcers ?Blood Sugars averaging: ?eye exam within last year: ? ? He has had 20 lb weight gain in last 6 months. In past he has done 6 months of weight watcher with some temporary success. ?  ?Wt Readings from Last 3 Encounters:  ?08/09/21 (!) 398 lb (180.5 kg)  ?11/17/20 (!) 371 lb (168.3 kg)  ?09/14/19 (!) 378 lb (171.5 kg)  ? ?Body mass index is 53.98 kg/m?. ? ?  Hypertension:  Well controlled on lisinopril 10 mg daily ?BP Readings from Last 3 Encounters:  ?08/09/21 132/80  ?11/17/20 116/69  ?09/14/19 112/74  ?Using medication without problems or lightheadedness:  ?Chest pain with exertion: ?Edema: ?Short of breath: ?Average home BPs: ?Other issues: ? ? ?Relevant past medical, surgical, family and social history reviewed and updated as indicated. Interim medical history since our last visit reviewed. ?Allergies and medications reviewed and updated. ?Outpatient Medications Prior to Visit  ?Medication Sig Dispense Refill  ? glipiZIDE (GLUCOTROL XL) 10 MG 24 hr tablet TAKE 1 TABLET BY MOUTH EVERY DAY WITH BREAKFAST 30  tablet 0  ? levothyroxine (SYNTHROID) 200 MCG tablet TAKE 1 TABLET BY MOUTH DAILY BEFORE BREAKFAST. 90 tablet 3  ? levothyroxine (SYNTHROID) 50 MCG tablet TAKE 1 TABLET BY MOUTH EVERY DAY 90 tablet 3  ? lisinopril (ZESTRIL) 10 MG tablet TAKE 1 TABLET BY MOUTH EVERY DAY 90 tablet 0  ? metFORMIN (GLUCOPHAGE-XR) 500 MG 24 hr tablet TAKE 1 TABLET BY MOUTH EVERY DAY WITH BREAKFAST 30 tablet 0  ? simvastatin (ZOCOR) 40 MG tablet TAKE 1 TABLET BY MOUTH EVERYDAY AT BEDTIME 90 tablet 2  ? ?No facility-administered medications prior to visit.  ?  ? ?Per HPI unless specifically indicated in ROS section below ?Review of Systems  ?Constitutional:  Negative for fatigue and fever.  ?HENT:  Negative for ear pain.   ?Eyes:  Negative for pain.  ?Respiratory:  Negative for cough and shortness of breath.   ?Cardiovascular:  Negative for chest pain, palpitations and leg swelling.  ?Gastrointestinal:  Negative for abdominal pain.  ?Genitourinary:  Negative for dysuria.  ?Musculoskeletal:  Negative for arthralgias.  ?Neurological:  Negative for syncope, light-headedness and headaches.  ?Psychiatric/Behavioral:  Negative for dysphoric mood.   ?Objective:  ?BP 132/80   Pulse 70   Ht 6' (1.829 m)   Wt (!) 398 lb (180.5 kg)   SpO2 96%   BMI 53.98 kg/m?   ?Wt Readings from Last 3  Encounters:  ?08/09/21 (!) 398 lb (180.5 kg)  ?11/17/20 (!) 371 lb (168.3 kg)  ?09/14/19 (!) 378 lb (171.5 kg)  ?  ?  ?Physical Exam ?Constitutional:   ?   Appearance: He is well-developed.  ?HENT:  ?   Head: Normocephalic.  ?   Right Ear: Hearing normal.  ?   Left Ear: Hearing normal.  ?   Nose: Nose normal.  ?Neck:  ?   Thyroid: No thyroid mass or thyromegaly.  ?   Vascular: No carotid bruit.  ?   Trachea: Trachea normal.  ?Cardiovascular:  ?   Rate and Rhythm: Normal rate and regular rhythm.  ?   Pulses: Normal pulses.  ?   Heart sounds: Heart sounds not distant. No murmur heard. ?  No friction rub. No gallop.  ?   Comments: No peripheral edema ?Pulmonary:  ?    Effort: Pulmonary effort is normal. No respiratory distress.  ?   Breath sounds: Normal breath sounds.  ?Skin: ?   General: Skin is warm and dry.  ?   Findings: No rash.  ?Psychiatric:     ?   Speech: Speech normal.     ?   Behavior: Behavior normal.     ?   Thought Content: Thought content normal.  ? ?   ?Diabetic foot exam: ?Normal inspection ?No skin breakdown ?No calluses  ?Normal DP pulses ?Normal sensation to light touch and monofilament ?Nails normal ? ?Results for orders placed or performed in visit on 08/09/21  ?POCT glycosylated hemoglobin (Hb A1C)  ?Result Value Ref Range  ? Hemoglobin A1C 8.2 (A) 4.0 - 5.6 %  ? HbA1c POC (<> result, manual entry)    ? HbA1c, POC (prediabetic range)    ? HbA1c, POC (controlled diabetic range)    ? ? ? ?COVID 19 screen:  No recent travel or known exposure to [place] ?The patient denies respiratory symptoms of COVID 19 at this time. ?The importance of social distancing was discussed today.  ? ?Assessment and Plan ?Problem List Items Addressed This Visit   ? ? Hypertension associated with diabetes ([place])  ?  Stable, chronic.  Continue current medication. ? ? ?Lisinopril 10 mg p.o. daily ?  ?  ? Relevant Medications  ? Semaglutide,0.25 or 0.5MG /DOS, (OZEMPIC, 0.25 OR 0.5 MG/DOSE,) 2 MG/1.5ML SOPN  ? Morbid obesity ([place])  ?  Chronic, worsening control ? ?Associated with multiple comorbidity including sleep apnea diabetes, hypertension and high cholesterol. ?Start Ozempic 0.25 mg weekly for weight management.  We will continue other lifestyle changes as noted ? ?  ?  ? Relevant Medications  ? Semaglutide,0.25 or 0.5MG /DOS, (OZEMPIC, 0.25 OR 0.5 MG/DOSE,) 2 MG/1.5ML SOPN  ? 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  Chronic, poor control ? ?Adequate control on glipizide 10 mg XL daily and metformin ER 500 mg daily.  He is interested in starting a GLP-1 medication given poor control diabetes as well as obesity.  We discussed this medication  in detail including medication side effects, expected course and overall plan. ? ?He will start semaglutide 0.25 mg weekly and follow-up in 1 month for weight management. ? ?Of note he is also tried weight watchers without permanent weight loss for more than 6 months in the past.  He will continue working on low carbohydrate diet decrease portion size and will start cardiovascular regular exercise 3 to 5 days a week. ? ?  ?  ? Relevant Medications  ?  Semaglutide,0.25 or 0.5MG /DOS, (OZEMPIC, 0.25 OR 0.5 MG/DOSE,) 2 MG/1.5ML SOPN  ? ?Meds ordered this encounter  ?Medications  ? Semaglutide,0.25 or 0.5MG /DOS, (OZEMPIC, 0.25 OR 0.5 MG/DOSE,) 2 MG/1.5ML SOPN  ?  Sig: Inject 0.25 mg into the skin once a week.  ?  Dispense:  1.5 mL  ?  Refill:  11  ? blood glucose meter kit and supplies KIT  ?  Sig: Dispense based on patient and insurance preference. Use up to four times daily as directed.  ?  Dispense:  1 each  ?  Refill:  0  ?  Order Specific Question:   Number of strips  ?  Answer:   100  ?  Order Specific Question:   Number of lancets  ?  Answer:   100  ? ?Orders Placed This Encounter  ?Procedures  ? POCT glycosylated hemoglobin (Hb A1C)  ?  Associate with Z13.1  ? HM DIABETES FOOT EXAM  ?  This external order was created through the Results Console.  ? ? ?  ? ?Eliezer Lofts, MD  ? ?

## 2021-08-09 NOTE — Patient Instructions (Addendum)
Check blood sugar daily in AM and occasionally 2 hours after  meals. ?Start Ozempic 0.25 mg weekly. ?Work on regular exercise and low carbohydrate diet. ?Set up yearly eye exam for diabetes and have the opthalmologist send [place] a copy of the evaluation for the chart. ? ?

## 2021-08-09 NOTE — Assessment & Plan Note (Addendum)
Chronic, poor control ? ?Adequate control on glipizide 10 mg XL daily and metformin ER 500 mg daily.  He is interested in starting a GLP-1 medication given poor control diabetes as well as obesity.  We discussed this medication in detail including medication side effects, expected course and overall plan. ?He was also prescribed a glucometer kit with test strips and lancets to start checking his blood sugar regularly.  Information was provided to him on this in detail. ? ?He will start semaglutide 0.25 mg weekly and follow-up in 1 month for weight management. ? ?Of note he is also tried weight watchers without permanent weight loss for more than 6 months in the past.  He will continue working on low carbohydrate diet decrease portion size and will start cardiovascular regular exercise 3 to 5 days a week. ?

## 2021-08-09 NOTE — Assessment & Plan Note (Signed)
Stable, chronic.  Continue current medication.  Lisinopril 10 mg p.o. daily 

## 2021-08-18 ENCOUNTER — Other Ambulatory Visit: Payer: Self-pay | Admitting: Family Medicine

## 2021-08-21 ENCOUNTER — Other Ambulatory Visit: Payer: Self-pay | Admitting: Family Medicine

## 2021-09-03 ENCOUNTER — Other Ambulatory Visit: Payer: Self-pay | Admitting: Family Medicine

## 2021-09-06 ENCOUNTER — Encounter: Payer: Self-pay | Admitting: Family Medicine

## 2021-09-06 ENCOUNTER — Ambulatory Visit: Payer: BC Managed Care – PPO | Admitting: Family Medicine

## 2021-09-06 VITALS — BP 128/84 | HR 64 | Temp 98.4°F | Resp 16 | Ht 72.0 in | Wt 389.4 lb

## 2021-09-06 DIAGNOSIS — E1159 Type 2 diabetes mellitus with other circulatory complications: Secondary | ICD-10-CM

## 2021-09-06 NOTE — Progress Notes (Signed)
Patient ID: [name], male    DOB: 1965/06/08, 56 y.o.   MRN: [id number]  This visit was conducted in person.  BP 128/84   Pulse 64   Temp 98.4 F (36.9 C)   Resp 16   Ht 6' (1.829 m)   Wt (!) 389 lb 6 oz (176.6 kg)   SpO2 97%   BMI 52.81 kg/m    CC:  Chief Complaint  Patient presents with   Diabetes    Follow up Ozempic    Subjective:   HPI: [name] is a 56 y.o. male presenting on 09/06/2021 for Diabetes (Follow up [place])   Diabetes:  On 08/09/2021 OV Ozempic was started at 0.25 mg weekly dosing... now on 0.5 mg weekly  for last 2 weeks.  He reports he has not been administering it correctly... but has corrected his technique.  No N/V, he feels more full, less appetite.  He has been working on decreasing portion, decreasing carbohydrates.  He has been doing more  outside work in the garden, more farming.   He is working on this as a team as wife also on similar med, also couple friends.   He is using metformin XR  and glipizide. XL 10 mg daily Wt Readings from Last 3 Encounters:  09/06/21 (!) 389 lb 6 oz (176.6 kg)  08/09/21 (!) 398 lb (180.5 kg)  11/17/20 (!) 371 lb (168.3 kg)  Body mass index is 52.81 kg/m. Using medications without difficulties: Hypoglycemic episodes: Hyperglycemic episodes: Feet problems: none Blood Sugars averaging: 115-120 fasting eye exam within last year: due      Relevant past medical, surgical, family and social history reviewed and updated as indicated. Interim medical history since our last visit reviewed. Allergies and medications reviewed and updated. Outpatient Medications Prior to Visit  Medication Sig Dispense Refill   blood glucose meter kit and supplies KIT Dispense based on patient and insurance preference. Use up to four times daily as directed. 1 each 0   glipiZIDE (GLUCOTROL XL) 10 MG 24 hr tablet TAKE 1 TABLET BY MOUTH EVERY DAY WITH BREAKFAST 90 tablet 1   levothyroxine (SYNTHROID) 200 MCG tablet  TAKE 1 TABLET BY MOUTH DAILY BEFORE BREAKFAST. 90 tablet 3   levothyroxine (SYNTHROID) 50 MCG tablet TAKE 1 TABLET BY MOUTH EVERY DAY 90 tablet 3   lisinopril (ZESTRIL) 10 MG tablet TAKE 1 TABLET BY MOUTH EVERY DAY 90 tablet 1   metFORMIN (GLUCOPHAGE-XR) 500 MG 24 hr tablet TAKE 1 TABLET BY MOUTH EVERY DAY WITH BREAKFAST 90 tablet 1   Semaglutide,0.25 or 0.5MG /DOS, (OZEMPIC, 0.25 OR 0.5 MG/DOSE,) 2 MG/1.5ML SOPN Inject 0.25 mg into the skin once a week. 1.5 mL 11   simvastatin (ZOCOR) 40 MG tablet TAKE 1 TABLET BY MOUTH EVERYDAY AT BEDTIME 90 tablet 2   No facility-administered medications prior to visit.     Per HPI unless specifically indicated in ROS section below Review of Systems  Constitutional:  Negative for fatigue and fever.  HENT:  Negative for ear pain.   Eyes:  Negative for pain.  Respiratory:  Negative for cough and shortness of breath.   Cardiovascular:  Negative for chest pain, palpitations and leg swelling.  Gastrointestinal:  Negative for abdominal pain.  Genitourinary:  Negative for dysuria.  Musculoskeletal:  Negative for arthralgias.  Neurological:  Negative for syncope, light-headedness and headaches.  Psychiatric/Behavioral:  Negative for dysphoric mood.    Objective:  BP 128/84   Pulse 64  Temp 98.4 F (36.9 C)   Resp 16   Ht 6' (1.829 m)   Wt (!) 389 lb 6 oz (176.6 kg)   SpO2 97%   BMI 52.81 kg/m   Wt Readings from Last 3 Encounters:  09/06/21 (!) 389 lb 6 oz (176.6 kg)  08/09/21 (!) 398 lb (180.5 kg)  11/17/20 (!) 371 lb (168.3 kg)      Physical Exam Constitutional:      Appearance: He is well-developed.  HENT:     Head: Normocephalic.     Right Ear: Hearing normal.     Left Ear: Hearing normal.     Nose: Nose normal.  Neck:     Thyroid: No thyroid mass or thyromegaly.     Vascular: No carotid bruit.     Trachea: Trachea normal.  Cardiovascular:     Rate and Rhythm: Normal rate and regular rhythm.     Pulses: Normal pulses.     Heart  sounds: Heart sounds not distant. No murmur heard.    No friction rub. No gallop.     Comments: No peripheral edema Pulmonary:     Effort: Pulmonary effort is normal. No respiratory distress.     Breath sounds: Normal breath sounds.  Skin:    General: Skin is warm and dry.     Findings: No rash.  Psychiatric:        Speech: Speech normal.        Behavior: Behavior normal.        Thought Content: Thought content normal.       Results for orders placed or performed in visit on 08/09/21  POCT glycosylated hemoglobin (Hb A1C)  Result Value Ref Range   Hemoglobin A1C 8.2 (A) 4.0 - 5.6 %   HbA1c POC (<> result, manual entry)     HbA1c, POC (prediabetic range)     HbA1c, POC (controlled diabetic range)    HM DIABETES FOOT EXAM  Result Value Ref Range   HM Diabetic Foot Exam done      COVID 19 screen:  No recent travel or known exposure to COVID19 The patient denies respiratory symptoms of COVID 19 at this time. The importance of social distancing was discussed today.   Assessment and Plan    Problem List Items Addressed This Visit     Morbid obesity ([place])   Type 2 diabetes mellitus with other circulatory complications ([place]) - Primary      Chronic,  Improving control per home lucose measurements.  Continueing weight management.  Tolerating Ozempic well.  Continue metfomrin XR 500 mg po daily And glucotrol XL 10 mg  Daily.  Will continue at 0.5 mg weekly ozempic and plan to increase to 1 mg weekly in 4 weeks if weight management stalled.          Eliezer Lofts, MD

## 2021-09-06 NOTE — Assessment & Plan Note (Signed)
Chronic,  Improving control per home lucose measurements.  Continueing weight management.  Tolerating Ozempic well.  Continue metfomrin XR 500 mg po daily And glucotrol XL 10 mg  Daily.  Will continue at 0.5 mg weekly ozempic and plan to increase to 1 mg weekly in 4 weeks if weight management stalled.

## 2021-09-06 NOTE — Patient Instructions (Addendum)
Plan to increase to  1 mg weekly Ozempic in 1 month.  Continue working on healthy eating and lifestyle changes.

## 2021-09-21 ENCOUNTER — Encounter: Payer: Self-pay | Admitting: Family Medicine

## 2021-09-25 MED ORDER — SEMAGLUTIDE (1 MG/DOSE) 4 MG/3ML ~~LOC~~ SOPN
1.0000 mg | PEN_INJECTOR | SUBCUTANEOUS | 11 refills | Status: DC
Start: 2021-09-25 — End: 2021-11-06

## 2021-10-04 ENCOUNTER — Ambulatory Visit: Payer: BC Managed Care – PPO | Admitting: Family Medicine

## 2021-10-22 ENCOUNTER — Other Ambulatory Visit: Payer: Self-pay | Admitting: Family Medicine

## 2021-10-25 ENCOUNTER — Telehealth: Payer: Self-pay | Admitting: Family Medicine

## 2021-10-25 DIAGNOSIS — Z1159 Encounter for screening for other viral diseases: Secondary | ICD-10-CM

## 2021-10-25 DIAGNOSIS — E039 Hypothyroidism, unspecified: Secondary | ICD-10-CM

## 2021-10-25 DIAGNOSIS — E1159 Type 2 diabetes mellitus with other circulatory complications: Secondary | ICD-10-CM

## 2021-10-25 DIAGNOSIS — Z125 Encounter for screening for malignant neoplasm of prostate: Secondary | ICD-10-CM

## 2021-10-25 NOTE — Telephone Encounter (Signed)
-----   Message from Alvina Chou sent at 10/15/2021  3:19 PM EDT ----- Regarding: Lab orders for Thursday, 8.3.23 Patient is scheduled for CPX labs, please order future labs, Thanks , Camelia Eng

## 2021-11-01 ENCOUNTER — Other Ambulatory Visit (INDEPENDENT_AMBULATORY_CARE_PROVIDER_SITE_OTHER): Payer: BC Managed Care – PPO

## 2021-11-01 DIAGNOSIS — E039 Hypothyroidism, unspecified: Secondary | ICD-10-CM

## 2021-11-01 DIAGNOSIS — Z1159 Encounter for screening for other viral diseases: Secondary | ICD-10-CM | POA: Diagnosis not present

## 2021-11-01 DIAGNOSIS — Z125 Encounter for screening for malignant neoplasm of prostate: Secondary | ICD-10-CM

## 2021-11-01 DIAGNOSIS — E1159 Type 2 diabetes mellitus with other circulatory complications: Secondary | ICD-10-CM

## 2021-11-01 LAB — COMPREHENSIVE METABOLIC PANEL
ALT: 26 U/L (ref 0–53)
AST: 19 U/L (ref 0–37)
Albumin: 4.2 g/dL (ref 3.5–5.2)
Alkaline Phosphatase: 53 U/L (ref 39–117)
BUN: 9 mg/dL (ref 6–23)
CO2: 28 mEq/L (ref 19–32)
Calcium: 9.4 mg/dL (ref 8.4–10.5)
Chloride: 100 mEq/L (ref 96–112)
Creatinine, Ser: 0.76 mg/dL (ref 0.40–1.50)
GFR: 102.24 mL/min (ref >60.00)
Glucose, Bld: 133 mg/dL — ABNORMAL HIGH (ref 70–99)
Potassium: 4.5 mEq/L (ref 3.5–5.1)
Sodium: 136 mEq/L (ref 135–145)
Total Bilirubin: 0.7 mg/dL (ref 0.2–1.2)
Total Protein: 7.5 g/dL (ref 6.0–8.3)

## 2021-11-01 LAB — T3, FREE: T3, Free: 3.3 pg/mL (ref 2.3–4.2)

## 2021-11-01 LAB — LIPID PANEL
Cholesterol: 160 mg/dL (ref 0–200)
HDL: 38.2 mg/dL — ABNORMAL LOW (ref >39.00)
LDL Cholesterol: 101 mg/dL — ABNORMAL HIGH (ref 0–99)
NonHDL: 122.15
Total CHOL/HDL Ratio: 4
Triglycerides: 105 mg/dL (ref 0.0–149.0)
VLDL: 21 mg/dL (ref 0.0–40.0)

## 2021-11-01 LAB — HEMOGLOBIN A1C: Hgb A1c MFr Bld: 6.8 % — ABNORMAL HIGH (ref 4.6–6.5)

## 2021-11-01 LAB — PSA: PSA: 0.15 ng/mL (ref 0.10–4.00)

## 2021-11-01 LAB — TSH: TSH: 1.57 u[IU]/mL (ref 0.35–5.50)

## 2021-11-01 LAB — T4, FREE: Free T4: 1.15 ng/dL (ref 0.60–1.60)

## 2021-11-01 NOTE — Progress Notes (Signed)
No critical labs need to be addressed urgently. We will discuss labs in detail at upcoming office visit.   

## 2021-11-02 LAB — HEPATITIS C ANTIBODY: Hepatitis C Ab: NONREACTIVE

## 2021-11-02 NOTE — Progress Notes (Signed)
No critical labs need to be addressed urgently. We will discuss labs in detail at upcoming office visit.   

## 2021-11-06 ENCOUNTER — Ambulatory Visit (INDEPENDENT_AMBULATORY_CARE_PROVIDER_SITE_OTHER): Payer: BC Managed Care – PPO | Admitting: Family Medicine

## 2021-11-06 ENCOUNTER — Encounter: Payer: Self-pay | Admitting: Family Medicine

## 2021-11-06 VITALS — BP 106/72 | HR 70 | Temp 98.3°F | Ht 71.5 in | Wt 377.5 lb

## 2021-11-06 DIAGNOSIS — E1159 Type 2 diabetes mellitus with other circulatory complications: Secondary | ICD-10-CM

## 2021-11-06 DIAGNOSIS — E039 Hypothyroidism, unspecified: Secondary | ICD-10-CM

## 2021-11-06 DIAGNOSIS — E1169 Type 2 diabetes mellitus with other specified complication: Secondary | ICD-10-CM

## 2021-11-06 DIAGNOSIS — Z0000 Encounter for general adult medical examination without abnormal findings: Secondary | ICD-10-CM

## 2021-11-06 DIAGNOSIS — E785 Hyperlipidemia, unspecified: Secondary | ICD-10-CM

## 2021-11-06 DIAGNOSIS — I152 Hypertension secondary to endocrine disorders: Secondary | ICD-10-CM

## 2021-11-06 MED ORDER — SEMAGLUTIDE (2 MG/DOSE) 8 MG/3ML ~~LOC~~ SOPN
2.0000 mg | PEN_INJECTOR | SUBCUTANEOUS | 9 refills | Status: DC
  Filled 2022-01-22: qty 3, 28d supply, fill #0
  Filled 2022-01-31 – 2022-02-26 (×2): qty 3, 28d supply, fill #1
  Filled 2022-03-04 – 2022-03-23 (×3): qty 3, 28d supply, fill #2
  Filled 2022-05-01: qty 3, 28d supply, fill #3
  Filled 2022-05-22 – 2022-05-23 (×2): qty 3, 28d supply, fill #4
  Filled 2022-06-20: qty 3, 28d supply, fill #5
  Filled 2022-06-27 – 2022-07-17 (×5): qty 3, 28d supply, fill #6
  Filled 2022-08-14: qty 3, 28d supply, fill #7
  Filled 2022-09-18: qty 3, 28d supply, fill #8
  Filled 2022-10-11: qty 3, 28d supply, fill #9

## 2021-11-06 NOTE — Assessment & Plan Note (Signed)
Chronic, well-controlled on high-dose levothyroxine 250 mcg daily.

## 2021-11-06 NOTE — Patient Instructions (Addendum)
Set up yearly eye exam for diabetes and have the opthalmologist send [place] a copy of the evaluation for the chart.  Increase the semaglutide to 2 mg weekly. Keep up the great work with healthy eating and weight loss!z  Work on regular exercise 3-5 days a week, 30-50 min a time.

## 2021-11-06 NOTE — Assessment & Plan Note (Signed)
Chronic, associated with hypertension, significant improvement in A1c with addition of semaglutide 1 mg weekly.  He has noted some amount of plateauing with weight loss and decreased appetite so we will increase his semaglutide to max at 2 mg weekly.  He will continue for now the metformin and glipizide at current doses but will call if blood sugars dropping with further weight loss. He will follow-up in 3 months for reevaluation.

## 2021-11-06 NOTE — Assessment & Plan Note (Signed)
He has lost 22 pounds to date since initiation of semaglutide.  We will increase this further to 2 mg weekly and follow-up in 3 months.  He was encouraged to continue working on healthy eating and regular exercise.

## 2021-11-06 NOTE — Progress Notes (Signed)
Patient ID: [name], male    DOB: [birth year], 56 y.o.   MRN: [id number]  This visit was conducted in person.  BP 106/72   Pulse 70   Temp 98.3 F (36.8 C) (Oral)   Ht 5' 11.5" (1.816 m)   Wt (!) 377 lb 8 oz (171.2 kg)   SpO2 96%   BMI 51.92 kg/m    CC:  Chief Complaint  Patient presents with   Annual Exam    Subjective:   HPI: [name] is a 56 y.o. male presenting on 11/06/2021 for Annual Exam  Hypertension:  Significant improvement with weight loss on lisinopril 10 mg daily BP Readings from Last 3 Encounters:  11/06/21 106/72  09/06/21 128/84  08/09/21 132/80  Using medication without problems or lightheadedness:  none Chest pain with exertion: none Edema: none Short of breath: none Average home BPs: 105-112/68-74 Other issues:  Elevated Cholesterol: LDL almost at goal less than 100 on simvastatin 40 mg daily Lab Results  Component Value Date   CHOL 160 11/01/2021   HDL 38.20 (L) 11/01/2021   LDLCALC 101 (H) 11/01/2021   LDLDIRECT 150.4 05/19/2007   TRIG 105.0 11/01/2021   CHOLHDL 4 11/01/2021  Using medications without problems: Muscle aches:  Diet compliance:  good Exercise: outdoor work Other complaints:  Diabetes: Significant improvement with addition of semaglutide (now at 1 mg dose weekly).  A1c now at goal. He has noted some plateau ing of decreased  appetite and weight loss. No abd pain, no nausea. Also taking glipizide XL 10 mg p.o. daily and metformin 500 mg XR 1 tablet daily Lab Results  Component Value Date   HGBA1C 6.8 (H) 11/01/2021  Using medications without difficulties: Hypoglycemic episodes: none Hyperglycemic episodes: none Feet problems: no ulcers Blood Sugars averaging: FBS 100 eye exam within last year: due  Morbid obesity: He has lost 10 lbs in the last 2 months, and an additional 10 prior to that. Wt Readings from Last 3 Encounters:  11/06/21 (!) 377 lb 8 oz (171.2 kg)  09/06/21 (!) 389 lb 6 oz (176.6 kg)   08/09/21 (!) 398 lb (180.5 kg)  Body mass index is 51.92 kg/m.   Hypothyroidism:  levothyroxine 200 and a 50 mcg daily.     Relevant past medical, surgical, family and social history reviewed and updated as indicated. Interim medical history since our last visit reviewed. Allergies and medications reviewed and updated. Outpatient Medications Prior to Visit  Medication Sig Dispense Refill   blood glucose meter kit and supplies KIT Dispense based on patient and insurance preference. Use up to four times daily as directed. 1 each 0   glipiZIDE (GLUCOTROL XL) 10 MG 24 hr tablet TAKE 1 TABLET BY MOUTH EVERY DAY WITH BREAKFAST 90 tablet 1   levothyroxine (SYNTHROID) 200 MCG tablet TAKE 1 TABLET BY MOUTH DAILY BEFORE BREAKFAST. 90 tablet 3   levothyroxine (SYNTHROID) 50 MCG tablet TAKE 1 TABLET BY MOUTH EVERY DAY 90 tablet 3   lisinopril (ZESTRIL) 10 MG tablet TAKE 1 TABLET BY MOUTH EVERY DAY 90 tablet 1   metFORMIN (GLUCOPHAGE-XR) 500 MG 24 hr tablet TAKE 1 TABLET BY MOUTH EVERY DAY WITH BREAKFAST 90 tablet 1   Semaglutide, 1 MG/DOSE, 4 MG/3ML SOPN Inject 1 mg as directed once a week. 3 mL 11   simvastatin (ZOCOR) 40 MG tablet TAKE 1 TABLET BY MOUTH EVERYDAY AT BEDTIME 90 tablet 0   No facility-administered medications prior to visit.  Per HPI unless specifically indicated in ROS section below Review of Systems  Constitutional:  Negative for fatigue and fever.  HENT:  Negative for ear pain.   Eyes:  Negative for pain.  Respiratory:  Negative for cough and shortness of breath.   Cardiovascular:  Negative for chest pain, palpitations and leg swelling.  Gastrointestinal:  Negative for abdominal pain.  Genitourinary:  Negative for dysuria.  Musculoskeletal:  Negative for arthralgias.  Neurological:  Negative for syncope, light-headedness and headaches.  Psychiatric/Behavioral:  Negative for dysphoric mood.    Objective:  BP 106/72   Pulse 70   Temp 98.3 F (36.8 C) (Oral)   Ht 5'  11.5" (1.816 m)   Wt (!) 377 lb 8 oz (171.2 kg)   SpO2 96%   BMI 51.92 kg/m   Wt Readings from Last 3 Encounters:  11/06/21 (!) 377 lb 8 oz (171.2 kg)  09/06/21 (!) 389 lb 6 oz (176.6 kg)  08/09/21 (!) 398 lb (180.5 kg)      Physical Exam    Results for orders placed or performed in visit on 11/01/21  Hepatitis C antibody  Result Value Ref Range   Hepatitis C Ab NON-REACTIVE NON-REACTIVE  PSA  Result Value Ref Range   PSA 0.15 0.10 - 4.00 ng/mL  TSH  Result Value Ref Range   TSH 1.57 0.35 - 5.50 uIU/mL  T3, free  Result Value Ref Range   T3, Free 3.3 2.3 - 4.2 pg/mL  T4, free  Result Value Ref Range   Free T4 1.15 0.60 - 1.60 ng/dL  Comprehensive metabolic panel  Result Value Ref Range   Sodium 136 135 - 145 mEq/L   Potassium 4.5 3.5 - 5.1 mEq/L   Chloride 100 96 - 112 mEq/L   CO2 28 19 - 32 mEq/L   Glucose, Bld 133 (H) 70 - 99 mg/dL   BUN 9 6 - 23 mg/dL   Creatinine, Ser 0.76 0.40 - 1.50 mg/dL   Total Bilirubin 0.7 0.2 - 1.2 mg/dL   Alkaline Phosphatase 53 39 - 117 U/L   AST 19 0 - 37 U/L   ALT 26 0 - 53 U/L   Total Protein 7.5 6.0 - 8.3 g/dL   Albumin 4.2 3.5 - 5.2 g/dL   GFR 102.24 >60.00 mL/min   Calcium 9.4 8.4 - 10.5 mg/dL  Lipid panel  Result Value Ref Range   Cholesterol 160 0 - 200 mg/dL   Triglycerides 105.0 0.0 - 149.0 mg/dL   HDL 38.20 (L) >39.00 mg/dL   VLDL 21.0 0.0 - 40.0 mg/dL   LDL Cholesterol 101 (H) 0 - 99 mg/dL   Total CHOL/HDL Ratio 4    NonHDL 122.15   Hemoglobin A1c  Result Value Ref Range   Hgb A1c MFr Bld 6.8 (H) 4.6 - 6.5 %     COVID 19 screen:  No recent travel or known exposure to COVID19 The patient denies respiratory symptoms of COVID 19 at this time. The importance of social distancing was discussed today.   Assessment and Plan   The patient's preventative maintenance and recommended screening tests for an annual wellness exam were reviewed in full today. Brought up to date unless services declined.  Counselled on  the importance of diet, exercise, and its role in overall health and mortality. The patient's FH and SH was reviewed, including their home life, tobacco status, and drug and alcohol status.   Vaccines: Uptodate except refused COVID, considering Shingrix Prostate Cancer Screen: no early  family history  Lab Results  Component Value Date   PSA 0.15 11/01/2021   PSA 0.24 11/10/2020   PSA 0.19 05/31/2019  Colon Cancer Screen:  OVERDUE, no early family history, plan colonoscopy with Dr. Hilarie Fredrickson once he loses more weight.      Smoking Status: none ETOH/ drug use: none/none   HIV screen:  Refused.      Problem List Items Addressed This Visit     Hyperlipidemia associated with type 2 diabetes mellitus ([place])    Chronic, associated with diabetes LDL almost at goal less than 100 on simvastatin 40 mg p.o. p.o. daily      Relevant Medications   Semaglutide, 2 MG/DOSE, 8 MG/3ML SOPN   Hypertension associated with diabetes ([facility])    Stable, chronic.  Continue current medication.   Lisinopril 10 mg p.o. daily      Relevant Medications   Semaglutide, 2 MG/DOSE, 8 MG/3ML SOPN   Hypothyroidism    Chronic, well-controlled on high-dose levothyroxine 250 mcg daily.      Morbid obesity ([place])    He has lost 22 pounds to date since initiation of semaglutide.  We will increase this further to 2 mg weekly and follow-up in 3 months.  He was encouraged to continue working on healthy eating and regular exercise.      Relevant Medications   Semaglutide, 2 MG/DOSE, 8 MG/3ML SOPN   Type 2 diabetes mellitus with other circulatory complications ([facility])    Chronic, associated with hypertension, significant improvement in A1c with addition of semaglutide 1 mg weekly.  He has noted some amount of plateauing with weight loss and decreased appetite so we will increase his semaglutide to max at 2 mg weekly.  He will continue for now the metformin and glipizide at current doses but will call if blood sugars dropping  with further weight loss. He will follow-up in 3 months for reevaluation.      Relevant Medications   Semaglutide, 2 MG/DOSE, 8 MG/3ML SOPN   Other Visit Diagnoses     Routine general medical examination at a health care facility    -  Primary         Eliezer Lofts, MD

## 2021-11-06 NOTE — Assessment & Plan Note (Signed)
Stable, chronic.  Continue current medication.  Lisinopril 10 mg p.o. daily 

## 2021-11-06 NOTE — Assessment & Plan Note (Signed)
Chronic, associated with diabetes LDL almost at goal less than 100 on simvastatin 40 mg p.o. p.o. daily

## 2022-01-18 ENCOUNTER — Other Ambulatory Visit (HOSPITAL_COMMUNITY): Payer: Self-pay

## 2022-01-21 ENCOUNTER — Other Ambulatory Visit: Payer: Self-pay | Admitting: Family Medicine

## 2022-01-22 ENCOUNTER — Other Ambulatory Visit (HOSPITAL_COMMUNITY): Payer: Self-pay

## 2022-01-22 MED FILL — Metformin HCl Tab ER 24HR 500 MG: ORAL | 90 days supply | Qty: 90 | Fill #0 | Status: AC

## 2022-01-22 MED FILL — Lisinopril Tab 10 MG: ORAL | 6 days supply | Qty: 6 | Fill #0 | Status: CN

## 2022-01-22 MED FILL — Simvastatin Tab 40 MG: ORAL | 90 days supply | Qty: 90 | Fill #0 | Status: AC

## 2022-01-22 MED FILL — Glipizide Tab ER 24HR 10 MG: ORAL | 90 days supply | Qty: 90 | Fill #0 | Status: AC

## 2022-01-23 ENCOUNTER — Other Ambulatory Visit (HOSPITAL_COMMUNITY): Payer: Self-pay

## 2022-01-28 ENCOUNTER — Other Ambulatory Visit (HOSPITAL_COMMUNITY): Payer: Self-pay

## 2022-01-31 ENCOUNTER — Other Ambulatory Visit (HOSPITAL_COMMUNITY): Payer: Self-pay

## 2022-02-07 ENCOUNTER — Ambulatory Visit: Payer: BC Managed Care – PPO | Admitting: Family Medicine

## 2022-02-15 ENCOUNTER — Ambulatory Visit: Payer: BC Managed Care – PPO | Admitting: Family Medicine

## 2022-02-26 ENCOUNTER — Other Ambulatory Visit (HOSPITAL_COMMUNITY): Payer: Self-pay

## 2022-02-28 ENCOUNTER — Other Ambulatory Visit (HOSPITAL_COMMUNITY): Payer: Self-pay

## 2022-03-04 ENCOUNTER — Other Ambulatory Visit (HOSPITAL_COMMUNITY): Payer: Self-pay

## 2022-03-20 ENCOUNTER — Other Ambulatory Visit (HOSPITAL_COMMUNITY): Payer: Self-pay

## 2022-03-26 ENCOUNTER — Other Ambulatory Visit: Payer: Self-pay

## 2022-04-05 ENCOUNTER — Ambulatory Visit: Payer: BC Managed Care – PPO | Admitting: Family Medicine

## 2022-04-05 ENCOUNTER — Other Ambulatory Visit (HOSPITAL_COMMUNITY): Payer: Self-pay

## 2022-04-05 ENCOUNTER — Encounter: Payer: Self-pay | Admitting: Family Medicine

## 2022-04-05 VITALS — BP 100/68 | HR 71 | Temp 98.0°F | Resp 16 | Ht 71.5 in | Wt 360.0 lb

## 2022-04-05 DIAGNOSIS — E1159 Type 2 diabetes mellitus with other circulatory complications: Secondary | ICD-10-CM

## 2022-04-05 DIAGNOSIS — I152 Hypertension secondary to endocrine disorders: Secondary | ICD-10-CM

## 2022-04-05 DIAGNOSIS — G4733 Obstructive sleep apnea (adult) (pediatric): Secondary | ICD-10-CM | POA: Diagnosis not present

## 2022-04-05 DIAGNOSIS — E119 Type 2 diabetes mellitus without complications: Secondary | ICD-10-CM | POA: Diagnosis not present

## 2022-04-05 LAB — POCT GLYCOSYLATED HEMOGLOBIN (HGB A1C): Hemoglobin A1C: 6 % — AB (ref 4.0–5.6)

## 2022-04-05 NOTE — Assessment & Plan Note (Signed)
Chronic, improved with weight loss Stop glipizide.  Continue semaglutide 2 mg weekly.  Continue metformin XL 500 mg p.o. daily.  Encouraged exercise, weight loss, healthy eating habits.

## 2022-04-05 NOTE — Patient Instructions (Addendum)
Stop glipizide. Continue semaglutide 2 mg weekly and metformin daily. Set up yearly eye exam for diabetes and have the opthalmologist send [place] a copy of the evaluation for the chart.

## 2022-04-05 NOTE — Progress Notes (Signed)
Patient ID: [name], male    DOB: May 24, 1965, 57 y.o.   MRN: [id number]  This visit was conducted in person.  BP 100/68   Pulse 71   Temp 98 F (36.7 C)   Resp 16   Ht 5' 11.5" (1.816 m)   Wt (!) 360 lb (163.3 kg)   SpO2 97%   BMI 49.51 kg/m    CC:  Chief Complaint  Patient presents with   Diabetes    Subjective:   HPI: [name] is a 57 y.o. male presenting on 04/05/2022 for Diabetes  Diabetes: Continued improvement of A1C on semaglutide at 2 mg weekly. Lab Results  Component Value Date   HGBA1C 6.0 (A) 04/05/2022  Using medications without difficulties: none Hypoglycemic episodes: none Hyperglycemic episodes: Feet problems: no uclers. Blood Sugars averaging: not checking. eye exam within last year:   He has lost about 30 lbs since starting semaglutide Wt Readings from Last 3 Encounters:  04/05/22 (!) 360 lb (163.3 kg)  11/06/21 (!) 377 lb 8 oz (171.2 kg)  09/06/21 (!) 389 lb 6 oz (176.6 kg)    Hypertension:  Continued improvement with weight loss BP Readings from Last 3 Encounters:  04/05/22 100/68  11/06/21 106/72  09/06/21 128/84  Using medication without problems or lightheadedness:  none Chest pain with exertion: none Edema:none Short of breath: none Average home BPs: Other issues:   Sleep apnea diagnosed greater than 10 years ago.  Has not had a new machine in many years. Last sleep study at [facility].  Not on record.     Relevant past medical, surgical, family and social history reviewed and updated as indicated. Interim medical history since our last visit reviewed. Allergies and medications reviewed and updated. Outpatient Medications Prior to Visit  Medication Sig Dispense Refill   blood glucose meter kit and supplies KIT Dispense based on patient and insurance preference. Use up to four times daily as directed. 1 each 0   glipiZIDE (GLUCOTROL XL) 10 MG 24 hr tablet TAKE 1 TABLET BY MOUTH EVERY DAY WITH BREAKFAST 90  tablet 0   levothyroxine (SYNTHROID) 200 MCG tablet TAKE 1 TABLET BY MOUTH EVERY DAY BEFORE BREAKFAST 90 tablet 3   levothyroxine (SYNTHROID) 50 MCG tablet TAKE 1 TABLET BY MOUTH EVERY DAY 90 tablet 3   lisinopril (ZESTRIL) 10 MG tablet TAKE 1 TABLET BY MOUTH EVERY DAY 90 tablet 1   metFORMIN (GLUCOPHAGE-XR) 500 MG 24 hr tablet TAKE 1 TABLET BY MOUTH EVERY DAY WITH BREAKFAST 90 tablet 0   Semaglutide, 2 MG/DOSE, 8 MG/3ML SOPN Inject 2 mg as directed once a week. 3 mL 9   simvastatin (ZOCOR) 40 MG tablet TAKE 1 TABLET BY MOUTH EVERYDAY AT BEDTIME 90 tablet 0   No facility-administered medications prior to visit.     Per HPI unless specifically indicated in ROS section below Review of Systems  Constitutional:  Negative for fatigue and fever.  HENT:  Negative for ear pain.   Eyes:  Negative for pain.  Respiratory:  Negative for cough and shortness of breath.   Cardiovascular:  Negative for chest pain, palpitations and leg swelling.  Gastrointestinal:  Negative for abdominal pain.  Genitourinary:  Negative for dysuria.  Musculoskeletal:  Negative for arthralgias.  Neurological:  Negative for syncope, light-headedness and headaches.  Psychiatric/Behavioral:  Negative for dysphoric mood.    Objective:  BP 100/68   Pulse 71   Temp 98 F (36.7 C)   Resp 16  Ht 5' 11.5" (1.816 m)   Wt (!) 360 lb (163.3 kg)   SpO2 97%   BMI 49.51 kg/m   Wt Readings from Last 3 Encounters:  04/05/22 (!) 360 lb (163.3 kg)  11/06/21 (!) 377 lb 8 oz (171.2 kg)  09/06/21 (!) 389 lb 6 oz (176.6 kg)      Physical Exam Constitutional:      Appearance: He is well-developed.  HENT:     Head: Normocephalic.     Right Ear: Hearing normal.     Left Ear: Hearing normal.     Nose: Nose normal.  Neck:     Thyroid: No thyroid mass or thyromegaly.     Vascular: No carotid bruit.     Trachea: Trachea normal.  Cardiovascular:     Rate and Rhythm: Normal rate and regular rhythm.     Pulses: Normal pulses.      Heart sounds: Heart sounds not distant. No murmur heard.    No friction rub. No gallop.     Comments: No peripheral edema Pulmonary:     Effort: Pulmonary effort is normal. No respiratory distress.     Breath sounds: Normal breath sounds.  Skin:    General: Skin is warm and dry.     Findings: No rash.  Psychiatric:        Speech: Speech normal.        Behavior: Behavior normal.        Thought Content: Thought content normal.       Results for orders placed or performed in visit on 04/05/22  POCT glycosylated hemoglobin (Hb A1C)  Result Value Ref Range   Hemoglobin A1C 6.0 (A) 4.0 - 5.6 %   HbA1c POC (<> result, manual entry)     HbA1c, POC (prediabetic range)     HbA1c, POC (controlled diabetic range)       COVID 19 screen:  No recent travel or known exposure to COVID19 The patient denies respiratory symptoms of COVID 19 at this time. The importance of social distancing was discussed today.   Assessment and Plan Problem List Items Addressed This Visit     Hypertension associated with diabetes ([place])    Stable, chronic.  Continue current medication.  No evidence of orthostatic hypotension so we will continue on lisinopril 10 mg p.o. daily for now as it is beneficial for kidney protection.      Obstructive sleep apnea syndrome    Reviewed last note from Dr. Elsworth Soho pulmonary from 2012. With weight loss in time patient likely needs a repeat sleep study.  I will referral back to pulmonary for repeat sleep study and new CPAP.      Relevant Orders   Ambulatory referral to Pulmonology   Type 2 diabetes mellitus with other circulatory complications (HCC)    Chronic, improved with weight loss Stop glipizide.  Continue semaglutide 2 mg weekly.  Continue metformin XL 500 mg p.o. daily.  Encouraged exercise, weight loss, healthy eating habits.       Other Visit Diagnoses     Controlled type 2 diabetes mellitus without complication, without long-term current use of insulin  ([place])    -  Primary   Relevant Orders   POCT glycosylated hemoglobin (Hb A1C) (Completed)   Hemoglobin A1c   Lipid panel   Comprehensive metabolic panel   Microalbumin / creatinine urine ratio       Orders Placed This Encounter  Procedures   Hemoglobin A1c    Standing Status:  Future    Standing Expiration Date:   07/04/2022   Lipid panel    Standing Status:   Future    Standing Expiration Date:   07/04/2022   Comprehensive metabolic panel    Standing Status:   Future    Standing Expiration Date:   07/04/2022   Microalbumin / creatinine urine ratio    Standing Status:   Future    Standing Expiration Date:   04/05/2023   Ambulatory referral to Pulmonology    Referral Priority:   Routine    Referral Type:   Consultation    Referral Reason:   Specialty Services Required    Requested Specialty:   Pulmonary Disease    Number of Visits Requested:   1   POCT glycosylated hemoglobin (Hb A1C)       Kerby Nora, MD

## 2022-04-05 NOTE — Assessment & Plan Note (Signed)
Reviewed last note from Dr. Elsworth Soho pulmonary from 2012. With weight loss in time patient likely needs a repeat sleep study.  I will referral back to pulmonary for repeat sleep study and new CPAP.

## 2022-04-05 NOTE — Assessment & Plan Note (Signed)
Stable, chronic.  Continue current medication.  No evidence of orthostatic hypotension so we will continue on lisinopril 10 mg p.o. daily for now as it is beneficial for kidney protection.

## 2022-04-08 ENCOUNTER — Other Ambulatory Visit (HOSPITAL_COMMUNITY): Payer: Self-pay

## 2022-04-08 MED ORDER — LEVOTHYROXINE SODIUM 200 MCG PO TABS
200.0000 ug | ORAL_TABLET | Freq: Every day | ORAL | 3 refills | Status: DC
  Filled 2022-04-08: qty 90, 90d supply, fill #0
  Filled 2022-07-08: qty 90, 90d supply, fill #1
  Filled 2022-07-11: qty 90, 90d supply, fill #2

## 2022-04-08 MED ORDER — LEVOTHYROXINE SODIUM 50 MCG PO TABS
50.0000 ug | ORAL_TABLET | Freq: Every day | ORAL | 3 refills | Status: DC
  Filled 2022-04-08: qty 90, 90d supply, fill #0
  Filled 2022-07-08: qty 90, 90d supply, fill #1
  Filled 2022-07-11: qty 90, 90d supply, fill #2

## 2022-04-18 ENCOUNTER — Other Ambulatory Visit: Payer: Self-pay | Admitting: Family Medicine

## 2022-04-19 ENCOUNTER — Other Ambulatory Visit (HOSPITAL_COMMUNITY): Payer: Self-pay

## 2022-04-19 MED ORDER — METFORMIN HCL ER 500 MG PO TB24
500.0000 mg | ORAL_TABLET | Freq: Every day | ORAL | 0 refills | Status: DC
  Filled 2022-04-19: qty 90, 90d supply, fill #0

## 2022-04-24 ENCOUNTER — Ambulatory Visit: Payer: BC Managed Care – PPO | Admitting: Family Medicine

## 2022-04-24 ENCOUNTER — Encounter: Payer: Self-pay | Admitting: Family Medicine

## 2022-04-24 VITALS — BP 100/60 | HR 79 | Temp 98.0°F | Resp 16 | Ht 71.5 in | Wt 362.5 lb

## 2022-04-24 DIAGNOSIS — R1011 Right upper quadrant pain: Secondary | ICD-10-CM | POA: Diagnosis not present

## 2022-04-24 LAB — CBC WITH DIFFERENTIAL/PLATELET
Basophils Absolute: 0.1 10*3/uL (ref 0.0–0.1)
Basophils Relative: 0.7 % (ref 0.0–3.0)
Eosinophils Absolute: 0.4 10*3/uL (ref 0.0–0.7)
Eosinophils Relative: 3.7 % (ref 0.0–5.0)
HCT: 37.6 % — ABNORMAL LOW (ref 39.0–52.0)
Hemoglobin: 12.6 g/dL — ABNORMAL LOW (ref 13.0–17.0)
Lymphocytes Relative: 27 % (ref 12.0–46.0)
Lymphs Abs: 2.7 10*3/uL (ref 0.7–4.0)
MCHC: 33.5 g/dL (ref 30.0–36.0)
MCV: 88.4 fl (ref 78.0–100.0)
Monocytes Absolute: 0.6 10*3/uL (ref 0.1–1.0)
Monocytes Relative: 6.4 % (ref 3.0–12.0)
Neutro Abs: 6.1 10*3/uL (ref 1.4–7.7)
Neutrophils Relative %: 62.2 % (ref 43.0–77.0)
Platelets: 336 10*3/uL (ref 150.0–400.0)
RBC: 4.26 Mil/uL (ref 4.22–5.81)
RDW: 13.6 % (ref 11.5–15.5)
WBC: 9.8 10*3/uL (ref 4.0–10.5)

## 2022-04-24 LAB — COMPREHENSIVE METABOLIC PANEL
ALT: 26 U/L (ref 0–53)
AST: 16 U/L (ref 0–37)
Albumin: 3.6 g/dL (ref 3.5–5.2)
Alkaline Phosphatase: 58 U/L (ref 39–117)
BUN: 10 mg/dL (ref 6–23)
CO2: 28 mEq/L (ref 19–32)
Calcium: 9.1 mg/dL (ref 8.4–10.5)
Chloride: 100 mEq/L (ref 96–112)
Creatinine, Ser: 0.85 mg/dL (ref 0.40–1.50)
GFR: 98.52 mL/min (ref >60.00)
Glucose, Bld: 226 mg/dL — ABNORMAL HIGH (ref 70–99)
Potassium: 4.1 mEq/L (ref 3.5–5.1)
Sodium: 136 mEq/L (ref 135–145)
Total Bilirubin: 0.3 mg/dL (ref 0.2–1.2)
Total Protein: 7.3 g/dL (ref 6.0–8.3)

## 2022-04-24 LAB — LIPASE: Lipase: 48 U/L (ref 11.0–59.0)

## 2022-04-24 NOTE — Progress Notes (Signed)
Patient ID: [name], male    DOB: 1965-04-25, 57 y.o.   MRN: [id number]  This visit was conducted in person.  BP 100/60   Pulse 79   Temp 98 F (36.7 C)   Resp 16   Ht 5' 11.5" (1.816 m)   Wt (!) 362 lb 8 oz (164.4 kg)   SpO2 97%   BMI 49.85 kg/m    CC:  Chief Complaint  Patient presents with   Flank Pain    X 1 week right side right under your ribs    Subjective:   HPI: [name] is a 57 y.o. male with Dm, HTN and morbid obesity presenting on 04/24/2022 for Flank Pain (X 1 week right side right under your ribs)  He reports after going on a cruise.. .. Had emesis and nausea... 1 week ago. No diarrhea, no blood in stool.  After that started having soreness in  upper abdomen from throwing up.  He limited diet, pushed water.  In last 2-3 day pain in RUQ abdomen...ache. .. No change with eating.  Last night he started feeling better.. pain has resolved.  No fever. Nml UOP.   He has his gallbladder.  He is on semaglutide for DM and weight management.  Last injection was 1/10 ( 4 days prior to emesis).. skipped last week when he was sick    Wt Readings from Last 3 Encounters:  04/24/22 (!) 362 lb 8 oz (164.4 kg)  04/05/22 (!) 360 lb (163.3 kg)  11/06/21 (!) 377 lb 8 oz (171.2 kg)     Relevant past medical, surgical, family and social history reviewed and updated as indicated. Interim medical history since our last visit reviewed. Allergies and medications reviewed and updated. Outpatient Medications Prior to Visit  Medication Sig Dispense Refill   blood glucose meter kit and supplies KIT Dispense based on patient and insurance preference. Use up to four times daily as directed. 1 each 0   levothyroxine (SYNTHROID) 200 MCG tablet TAKE 1 TABLET BY MOUTH EVERY DAY BEFORE BREAKFAST 90 tablet 3   levothyroxine (SYNTHROID) 200 MCG tablet Take 1 tablet (200 mcg total) by mouth daily before breakfast. 90 tablet 3   levothyroxine (SYNTHROID) 50 MCG tablet TAKE  1 TABLET BY MOUTH EVERY DAY 90 tablet 3   levothyroxine (SYNTHROID) 50 MCG tablet Take 1 tablet (50 mcg total) by mouth daily. 90 tablet 3   lisinopril (ZESTRIL) 10 MG tablet TAKE 1 TABLET BY MOUTH EVERY DAY 90 tablet 1   metFORMIN (GLUCOPHAGE-XR) 500 MG 24 hr tablet Take 1 tablet (500 mg total) by mouth daily with breakfast. 90 tablet 0   Semaglutide, 2 MG/DOSE, 8 MG/3ML SOPN Inject 2 mg as directed once a week. 3 mL 9   simvastatin (ZOCOR) 40 MG tablet TAKE 1 TABLET BY MOUTH EVERYDAY AT BEDTIME 90 tablet 0   glipiZIDE (GLUCOTROL XL) 10 MG 24 hr tablet TAKE 1 TABLET BY MOUTH EVERY DAY WITH BREAKFAST (Patient not taking: Reported on 04/24/2022) 90 tablet 0   No facility-administered medications prior to visit.     Per HPI unless specifically indicated in ROS section below Review of Systems  Constitutional:  Negative for fatigue and fever.  HENT:  Negative for ear pain.   Eyes:  Negative for pain.  Respiratory:  Negative for cough and shortness of breath.   Cardiovascular:  Negative for chest pain, palpitations and leg swelling.  Gastrointestinal:  Negative for abdominal pain.  Genitourinary:  Negative  for dysuria.  Musculoskeletal:  Negative for arthralgias.  Neurological:  Negative for syncope, light-headedness and headaches.  Psychiatric/Behavioral:  Negative for dysphoric mood.    Objective:  BP 100/60   Pulse 79   Temp 98 F (36.7 C)   Resp 16   Ht 5' 11.5" (1.816 m)   Wt (!) 362 lb 8 oz (164.4 kg)   SpO2 97%   BMI 49.85 kg/m   Wt Readings from Last 3 Encounters:  04/24/22 (!) 362 lb 8 oz (164.4 kg)  04/05/22 (!) 360 lb (163.3 kg)  11/06/21 (!) 377 lb 8 oz (171.2 kg)      Physical Exam Constitutional:      Appearance: He is well-developed.  HENT:     Head: Normocephalic.     Right Ear: Hearing normal.     Left Ear: Hearing normal.     Nose: Nose normal.  Neck:     Thyroid: No thyroid mass or thyromegaly.     Vascular: No carotid bruit.     Trachea: Trachea  normal.  Cardiovascular:     Rate and Rhythm: Normal rate and regular rhythm.     Pulses: Normal pulses.     Heart sounds: Heart sounds not distant. No murmur heard.    No friction rub. No gallop.     Comments: No peripheral edema Pulmonary:     Effort: Pulmonary effort is normal. No respiratory distress.     Breath sounds: Normal breath sounds.  Skin:    General: Skin is warm and dry.     Findings: No rash.  Psychiatric:        Speech: Speech normal.        Behavior: Behavior normal.        Thought Content: Thought content normal.       Results for orders placed or performed in visit on 04/05/22  POCT glycosylated hemoglobin (Hb A1C)  Result Value Ref Range   Hemoglobin A1C 6.0 (A) 4.0 - 5.6 %   HbA1c POC (<> result, manual entry)     HbA1c, POC (prediabetic range)     HbA1c, POC (controlled diabetic range)      Assessment and Plan  RUQ pain Assessment & Plan: Acute, Most likely secondary to viral gastroenteritis versus food poisoning.  Symptoms are now resolved.  Will check c-Met, CBC and lipase given he is semaglutide to make sure there is no evidence of issue with this medication.  If symptoms become recurrent we can consider doing a right upper quadrant ultrasound to look into gallbladder disease.  ER and return precautions provided  Orders: -     CBC with Differential/Platelet -     Comprehensive metabolic panel -     Lipase    Return if symptoms worsen or fail to improve.   Eliezer Lofts, MD

## 2022-04-24 NOTE — Assessment & Plan Note (Signed)
Acute, Most likely secondary to viral gastroenteritis versus food poisoning.  Symptoms are now resolved.  Will check c-Met, CBC and lipase given he is semaglutide to make sure there is no evidence of issue with this medication.  If symptoms become recurrent we can consider doing a right upper quadrant ultrasound to look into gallbladder disease.  ER and return precautions provided

## 2022-05-02 ENCOUNTER — Other Ambulatory Visit (HOSPITAL_COMMUNITY): Payer: Self-pay

## 2022-05-02 ENCOUNTER — Encounter (HOSPITAL_BASED_OUTPATIENT_CLINIC_OR_DEPARTMENT_OTHER): Payer: Self-pay | Admitting: Pulmonary Disease

## 2022-05-02 ENCOUNTER — Ambulatory Visit (INDEPENDENT_AMBULATORY_CARE_PROVIDER_SITE_OTHER): Payer: BC Managed Care – PPO | Admitting: Pulmonary Disease

## 2022-05-02 VITALS — BP 112/82 | HR 79 | Temp 97.6°F | Ht 71.0 in | Wt 361.4 lb

## 2022-05-02 DIAGNOSIS — G4733 Obstructive sleep apnea (adult) (pediatric): Secondary | ICD-10-CM

## 2022-05-02 NOTE — Assessment & Plan Note (Signed)
He is very compliant with his CPAP machine by report.  His machine is 55 years old and he is eligible for replacement.  CPAP is only helped improve his daytime somnolence and fatigue. We will provide him with a replacement auto CPAP 15 to 20 cm, obtain a download and tweak settings as needed. Will also set him up for a home sleep test to establish baseline  Weight loss encouraged, compliance with goal of at least 4-6 hrs every night is the expectation. Advised against medications with sedative side effects Cautioned against driving when sleepy - understanding that sleepiness will vary on a day to day basis   The pathophysiology of obstructive sleep apnea , it's cardiovascular consequences & modes of treatment including CPAP were discused with the patient in detail & they evidenced understanding.

## 2022-05-02 NOTE — Progress Notes (Signed)
Subjective:    Patient ID: [name], male    DOB: 1966/03/04, 57 y.o.   MRN: [id number]  HPI  Chief Complaint  Patient presents with   Consult    Pt states that he has had OSA on CPAP x 20 years. Pt states that he has had his CPAP for 10 years and needs a new CPAP.     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  We obtained a new CPAP for him which she has been using religiously for the past 10 to 12 years.  He now needs a replacement. States that he has lost about 40 pounds however his current weight is 361 which is 20 pounds more than his weight during the titration study.  He has settled down with a nasal mask and denies any problems with mask or pressure. Epworth sleepiness score is 6 and he denies excessive somnolence or sleep pressure or fatigue. Bedtime is between 9 and 10 PM sleep latency is minimal, he sleeps on his side with 2 pillows, reports 1-2 nocturnal awakenings and is up at 4:30 AM feeling rested without dryness of mouth or headaches There is no history suggestive of cataplexy, sleep paralysis or parasomnias   PMH -diabetes type 2 Hypothyroid   Significant tests/ events reviewed  CPAP titrqation 07/2010 Severe obstructive sleep apnea corrected by CPAP 17 cm (wt 340 lbs)    Past Medical History:  Diagnosis Date   Hyperlipidemia    Hypertension    hospitalization 2006   Pneumonia    hospitalization 2006    Past Surgical History:  Procedure Laterality Date   TONSILLECTOMY      No Known Allergies  Social History   Socioeconomic History   Marital status: Married    Spouse name: Not on file   Number of children: Not on file   Years of education: Not on file   Highest education level: Not on file  Occupational History   Occupation: lab tech for [employer]   Tobacco Use   Smoking status: Never    Passive exposure: Never   Smokeless tobacco: Never  Vaping Use   Vaping Use: Never used  Substance and Sexual Activity   Alcohol use: No   Drug use: No   Sexual activity: Not on file  Other Topics Concern   Not on file  Social History Narrative   Healthy children   Regular exercise - no   Diet: eats on the run, some fast food, working on [employer]   Social Determinants of Health   Financial Resource Strain: Not on file  Food Insecurity: Not on file  Transportation Needs: Not on file  Physical Activity: Not on file  Stress: Not on file  Social Connections: Not on file  Intimate Partner Violence: Not on file    Family History  Problem Relation Age of Onset   Diabetes Mother    Hypertension Mother    Diabetes Father    Hypertension Father    Heart attack Father 52   Diabetes Paternal Grandmother    Coronary artery disease Other        1st degree male relative <50   Cancer Neg Hx      Review of Systems Constitutional: negative for anorexia, fevers and  sweats  Eyes: negative for irritation, redness and visual disturbance  Ears, nose, mouth, throat, and face: negative for earaches, epistaxis, nasal congestion and sore throat  Respiratory: negative for cough, dyspnea on exertion, sputum and wheezing  Cardiovascular: negative for chest pain, dyspnea, lower extremity edema, orthopnea, palpitations and syncope  Gastrointestinal: negative for abdominal pain, constipation, diarrhea, melena, nausea and vomiting  Genitourinary:negative for dysuria, frequency and hematuria  Hematologic/lymphatic: negative for bleeding, easy bruising and lymphadenopathy  Musculoskeletal:negative for arthralgias, muscle weakness and stiff joints  Neurological: negative for coordination problems, gait problems, headaches and weakness  Endocrine: negative for diabetic symptoms including polydipsia, polyuria and weight loss     Objective:   Physical  Exam  Gen. Pleasant, obese, in no distress, normal affect ENT - no pallor,icterus, no post nasal drip, class 2-3 airway Neck: No JVD, no thyromegaly, no carotid bruits Lungs: no use of accessory muscles, no dullness to percussion, decreased without rales or rhonchi  Cardiovascular: Rhythm regular, heart sounds  normal, no murmurs or gallops, no peripheral edema Abdomen: soft and non-tender, no hepatosplenomegaly, BS normal. Musculoskeletal: No deformities, no cyanosis or clubbing Neuro:  alert, non focal, no tremors       Assessment & Plan:

## 2022-05-02 NOTE — Patient Instructions (Signed)
X schedule home sleep test.  X Rx to DME for auto CPAP 15 to 20 cm with new nasal mask

## 2022-05-02 NOTE — Assessment & Plan Note (Signed)
Weight loss encouraged He is on Ozempic and is hopeful of losing more weight

## 2022-05-03 ENCOUNTER — Other Ambulatory Visit: Payer: Self-pay

## 2022-05-03 ENCOUNTER — Other Ambulatory Visit (HOSPITAL_COMMUNITY): Payer: Self-pay

## 2022-05-03 ENCOUNTER — Other Ambulatory Visit: Payer: Self-pay | Admitting: Family Medicine

## 2022-05-03 MED ORDER — SIMVASTATIN 40 MG PO TABS
40.0000 mg | ORAL_TABLET | Freq: Every day | ORAL | 1 refills | Status: DC
  Filled 2022-05-03: qty 90, 90d supply, fill #0
  Filled 2022-08-14: qty 90, 90d supply, fill #1

## 2022-05-04 MED FILL — Lisinopril Tab 10 MG: ORAL | Qty: 90 | Fill #0 | Status: CN

## 2022-05-06 ENCOUNTER — Other Ambulatory Visit: Payer: Self-pay

## 2022-05-06 ENCOUNTER — Other Ambulatory Visit (HOSPITAL_COMMUNITY): Payer: Self-pay

## 2022-05-06 ENCOUNTER — Other Ambulatory Visit: Payer: Self-pay | Admitting: Family Medicine

## 2022-05-06 MED ORDER — LISINOPRIL 10 MG PO TABS
10.0000 mg | ORAL_TABLET | Freq: Every day | ORAL | 1 refills | Status: DC
  Filled 2022-05-06: qty 90, 90d supply, fill #0
  Filled 2022-07-30: qty 90, 90d supply, fill #1

## 2022-05-14 ENCOUNTER — Ambulatory Visit (HOSPITAL_BASED_OUTPATIENT_CLINIC_OR_DEPARTMENT_OTHER): Payer: BC Managed Care – PPO

## 2022-05-14 DIAGNOSIS — G4733 Obstructive sleep apnea (adult) (pediatric): Secondary | ICD-10-CM

## 2022-05-16 ENCOUNTER — Telehealth: Payer: Self-pay | Admitting: Pulmonary Disease

## 2022-05-16 DIAGNOSIS — G4733 Obstructive sleep apnea (adult) (pediatric): Secondary | ICD-10-CM | POA: Diagnosis not present

## 2022-05-16 NOTE — Telephone Encounter (Signed)
ATC patient. Left detailed voicemail (ok per dpr) with results. Will also send a mychart message to patient.

## 2022-05-16 NOTE — Telephone Encounter (Signed)
HST showed mild  OSA with AHI 10/ hr  Unfortunately oximetry tracing was absent throughout the study.  So this is likely an underestimate.  We should ideally repeat the study. However for the purpose of getting a replacement CPAP, the study will be suffice and can be sent to DME. I believe we have already sent in the prescription

## 2022-05-22 ENCOUNTER — Other Ambulatory Visit (HOSPITAL_COMMUNITY): Payer: Self-pay

## 2022-05-23 ENCOUNTER — Other Ambulatory Visit (HOSPITAL_COMMUNITY): Payer: Self-pay

## 2022-06-20 ENCOUNTER — Other Ambulatory Visit: Payer: Self-pay

## 2022-06-28 ENCOUNTER — Other Ambulatory Visit (HOSPITAL_COMMUNITY): Payer: Self-pay

## 2022-07-09 ENCOUNTER — Other Ambulatory Visit (HOSPITAL_COMMUNITY): Payer: Self-pay

## 2022-07-09 ENCOUNTER — Other Ambulatory Visit: Payer: Self-pay

## 2022-07-11 ENCOUNTER — Other Ambulatory Visit: Payer: Self-pay

## 2022-07-13 ENCOUNTER — Other Ambulatory Visit: Payer: Self-pay | Admitting: Family Medicine

## 2022-07-15 ENCOUNTER — Telehealth: Payer: Self-pay | Admitting: *Deleted

## 2022-07-15 ENCOUNTER — Other Ambulatory Visit: Payer: Self-pay

## 2022-07-15 DIAGNOSIS — E1169 Type 2 diabetes mellitus with other specified complication: Secondary | ICD-10-CM

## 2022-07-15 DIAGNOSIS — E119 Type 2 diabetes mellitus without complications: Secondary | ICD-10-CM

## 2022-07-15 MED ORDER — METFORMIN HCL ER 500 MG PO TB24
500.0000 mg | ORAL_TABLET | Freq: Every day | ORAL | 0 refills | Status: DC
  Filled 2022-07-15: qty 90, 90d supply, fill #0

## 2022-07-15 NOTE — Telephone Encounter (Signed)
Please call and schedule:  Return in about 3 months (around 07/05/2022) for diabetes follow up with fasting labs prior.

## 2022-07-15 NOTE — Telephone Encounter (Signed)
LVMTCB and sched

## 2022-07-15 NOTE — Telephone Encounter (Signed)
Sched for lab 4/18 and DM fu on 4/24

## 2022-07-15 NOTE — Telephone Encounter (Signed)
-----   Message from Alvina Chou sent at 07/15/2022  2:59 PM EDT ----- Regarding: Lab orders for Thursday, 4.18.24 Lab orders for DM f/u. Thanks

## 2022-07-16 NOTE — Addendum Note (Signed)
Addended byKerby Nora E on: 07/16/2022 10:06 AM   Modules accepted: Orders

## 2022-07-17 ENCOUNTER — Other Ambulatory Visit (INDEPENDENT_AMBULATORY_CARE_PROVIDER_SITE_OTHER): Payer: BC Managed Care – PPO

## 2022-07-17 DIAGNOSIS — E1169 Type 2 diabetes mellitus with other specified complication: Secondary | ICD-10-CM

## 2022-07-17 DIAGNOSIS — E119 Type 2 diabetes mellitus without complications: Secondary | ICD-10-CM

## 2022-07-17 DIAGNOSIS — E785 Hyperlipidemia, unspecified: Secondary | ICD-10-CM

## 2022-07-17 LAB — COMPREHENSIVE METABOLIC PANEL
ALT: 16 U/L (ref 0–53)
AST: 15 U/L (ref 0–37)
Albumin: 4.1 g/dL (ref 3.5–5.2)
Alkaline Phosphatase: 55 U/L (ref 39–117)
BUN: 10 mg/dL (ref 6–23)
CO2: 25 mEq/L (ref 19–32)
Calcium: 9.3 mg/dL (ref 8.4–10.5)
Chloride: 103 mEq/L (ref 96–112)
Creatinine, Ser: 0.77 mg/dL (ref 0.40–1.50)
GFR: 101.34 mL/min (ref >60.00)
Glucose, Bld: 149 mg/dL — ABNORMAL HIGH (ref 70–99)
Potassium: 3.9 mEq/L (ref 3.5–5.1)
Sodium: 137 mEq/L (ref 135–145)
Total Bilirubin: 0.4 mg/dL (ref 0.2–1.2)
Total Protein: 7.5 g/dL (ref 6.0–8.3)

## 2022-07-17 LAB — HEMOGLOBIN A1C: Hgb A1c MFr Bld: 7.1 % — ABNORMAL HIGH (ref 4.6–6.5)

## 2022-07-17 LAB — LIPID PANEL
Cholesterol: 132 mg/dL (ref 0–200)
HDL: 40.1 mg/dL (ref >39.00)
LDL Cholesterol: 74 mg/dL (ref 0–99)
NonHDL: 92.15
Total CHOL/HDL Ratio: 3
Triglycerides: 89 mg/dL (ref 0.0–149.0)
VLDL: 17.8 mg/dL (ref 0.0–40.0)

## 2022-07-17 NOTE — Progress Notes (Signed)
No critical labs need to be addressed urgently. We will discuss labs in detail at upcoming office visit.   

## 2022-07-17 NOTE — Addendum Note (Signed)
Addended by: Alvina Chou on: 07/17/2022 03:03 PM   Modules accepted: Orders

## 2022-07-18 ENCOUNTER — Other Ambulatory Visit: Payer: BC Managed Care – PPO

## 2022-07-18 ENCOUNTER — Other Ambulatory Visit (HOSPITAL_COMMUNITY): Payer: Self-pay

## 2022-07-24 ENCOUNTER — Encounter: Payer: Self-pay | Admitting: Family Medicine

## 2022-07-24 ENCOUNTER — Ambulatory Visit: Payer: BC Managed Care – PPO | Admitting: Family Medicine

## 2022-07-24 VITALS — BP 116/62 | HR 79 | Temp 97.3°F | Ht 71.0 in | Wt 349.0 lb

## 2022-07-24 DIAGNOSIS — Z7984 Long term (current) use of oral hypoglycemic drugs: Secondary | ICD-10-CM

## 2022-07-24 DIAGNOSIS — I152 Hypertension secondary to endocrine disorders: Secondary | ICD-10-CM

## 2022-07-24 DIAGNOSIS — E785 Hyperlipidemia, unspecified: Secondary | ICD-10-CM

## 2022-07-24 DIAGNOSIS — E1169 Type 2 diabetes mellitus with other specified complication: Secondary | ICD-10-CM | POA: Diagnosis not present

## 2022-07-24 DIAGNOSIS — E1159 Type 2 diabetes mellitus with other circulatory complications: Secondary | ICD-10-CM

## 2022-07-24 DIAGNOSIS — Z7985 Long-term (current) use of injectable non-insulin antidiabetic drugs: Secondary | ICD-10-CM

## 2022-07-24 NOTE — Patient Instructions (Addendum)
Set up yearly eye exam for diabetes and have the opthalmologist send [place] a copy of the evaluation for the chart.  Get back on track with low carb diet.. increase activity as able.  Work on exercise 5 days a week.

## 2022-07-24 NOTE — Assessment & Plan Note (Signed)
Stable, chronic.  Continue current medication.  No evidence of orthostatic hypotension so we will continue on lisinopril 10 mg p.o. daily for now as it is beneficial for kidney protection. 

## 2022-07-24 NOTE — Progress Notes (Signed)
Patient ID: RUBY DILONE, male    DOB: [birth year], 57 y.o.   MRN: [id number]  This visit was conducted in person.  BP 116/62 (BP Location: Left Arm, Patient Position: Sitting, Cuff Size: Large)   Pulse 79   Temp (!) 97.3 F (36.3 C) (Temporal)   Ht  (1.803 m)   Wt (!) 349 lb (158.3 kg)   SpO2 96%   BMI 48.68 kg/m    CC:  Chief Complaint  Patient presents with   Diabetes    Subjective:   HPI: [name] is a 57 y.o. male presenting on 07/24/2022 for Diabetes   Diabetes: Now inadequate control of A1C at 7.1. No longer on glipizide.  Using semaglutide 2 mg weekly and metformin XL 500 mg p.o. daily. He continues to lose weight and has lost 12 pounds in the last 3 months. Lab Results  Component Value Date   HGBA1C 7.1 (H) 07/17/2022  Using medications without difficulties: Hypoglycemic episodes:? Hyperglycemic episodes: ? Feet problems: none Blood Sugars averaging:   not checking. went on a cruise and went to [place] as well  for 8 days... sweet eye exam within last year: due Wt Readings from Last 3 Encounters:  07/24/22 (!) 349 lb (158.3 kg)  05/02/22 (!) 361 lb 6.4 oz (163.9 kg)  04/24/22 (!) 362 lb 8 oz (164.4 kg)   Elevated Cholesterol: .LDL at goal on simvastatin 40 mg p.o. daily Lab Results  Component Value Date   CHOL 132 07/17/2022   HDL 40.10 07/17/2022   LDLCALC 74 07/17/2022   LDLDIRECT 150.4 05/19/2007   TRIG 89.0 07/17/2022   CHOLHDL 3 07/17/2022  Using medications without problems: none Muscle aches:  none Diet compliance: heart healthy diet Exercise: starting back up exercise. Other complaints:     Hypertension: Well-controlled..  On lisinopril 10 mg p.o. daily. BP Readings from Last 3 Encounters:  07/24/22 116/62  05/02/22 112/82  04/24/22 100/60  Using medication without problems or lightheadedness:  none Chest pain with exertion: none Edema:none Short of breath:none Average home BPs: Other issues:   Relevant past  medical, surgical, family and social history reviewed and updated as indicated. Interim medical history since our last visit reviewed. Allergies and medications reviewed and updated. Outpatient Medications Prior to Visit  Medication Sig Dispense Refill   blood glucose meter kit and supplies KIT Dispense based on patient and insurance preference. Use up to four times daily as directed. 1 each 0   levothyroxine (SYNTHROID) 200 MCG tablet TAKE 1 TABLET BY MOUTH EVERY DAY BEFORE BREAKFAST 90 tablet 3   levothyroxine (SYNTHROID) 50 MCG tablet TAKE 1 TABLET BY MOUTH EVERY DAY 90 tablet 3   lisinopril (ZESTRIL) 10 MG tablet Take 1 tablet (10 mg total) by mouth daily. 90 tablet 1   metFORMIN (GLUCOPHAGE-XR) 500 MG 24 hr tablet Take 1 tablet (500 mg total) by mouth daily with breakfast. 90 tablet 0   Semaglutide, 2 MG/DOSE, 8 MG/3ML SOPN Inject 2 mg as directed once a week. 3 mL 9   simvastatin (ZOCOR) 40 MG tablet Take 1 tablet (40 mg total) by mouth at bedtime. 90 tablet 1   levothyroxine (SYNTHROID) 200 MCG tablet Take 1 tablet (200 mcg total) by mouth daily before breakfast. 90 tablet 3   levothyroxine (SYNTHROID) 50 MCG tablet Take 1 tablet (50 mcg total) by mouth daily. 90 tablet 3   No facility-administered medications prior to visit.     Per HPI unless specifically indicated in  ROS section below Review of Systems  Constitutional:  Negative for fatigue and fever.  HENT:  Negative for ear pain.   Eyes:  Negative for pain.  Respiratory:  Negative for cough and shortness of breath.   Cardiovascular:  Negative for chest pain, palpitations and leg swelling.  Gastrointestinal:  Negative for abdominal pain.  Genitourinary:  Negative for dysuria.  Musculoskeletal:  Negative for arthralgias.  Neurological:  Negative for syncope, light-headedness and headaches.  Psychiatric/Behavioral:  Negative for dysphoric mood.    Objective:  BP 116/62 (BP Location: Left Arm, Patient Position: Sitting, Cuff  Size: Large)   Pulse 79   Temp (!) 97.3 F (36.3 C) (Temporal)   Ht  (1.803 m)   Wt (!) 349 lb (158.3 kg)   SpO2 96%   BMI 48.68 kg/m   Wt Readings from Last 3 Encounters:  07/24/22 (!) 349 lb (158.3 kg)  05/02/22 (!) 361 lb 6.4 oz (163.9 kg)  04/24/22 (!) 362 lb 8 oz (164.4 kg)      Physical Exam Constitutional:      Appearance: He is well-developed. He is obese.  HENT:     Head: Normocephalic.     Right Ear: Hearing normal.     Left Ear: Hearing normal.     Nose: Nose normal.  Neck:     Thyroid: No thyroid mass or thyromegaly.     Vascular: No carotid bruit.     Trachea: Trachea normal.  Cardiovascular:     Rate and Rhythm: Normal rate and regular rhythm.     Pulses: Normal pulses.     Heart sounds: Heart sounds not distant. No murmur heard.    No friction rub. No gallop.     Comments: No peripheral edema Pulmonary:     Effort: Pulmonary effort is normal. No respiratory distress.     Breath sounds: Normal breath sounds.  Skin:    General: Skin is warm and dry.     Findings: No rash.  Psychiatric:        Speech: Speech normal.        Behavior: Behavior normal.        Thought Content: Thought content normal.       Results for orders placed or performed in visit on 07/17/22  Lipid panel  Result Value Ref Range   Cholesterol 132 0 - 200 mg/dL   Triglycerides 14.7 0.0 - 149.0 mg/dL   HDL 82.95 >62.13 mg/dL   VLDL 08.6 0.0 - 57.8 mg/dL   LDL Cholesterol 74 0 - 99 mg/dL   Total CHOL/HDL Ratio 3    NonHDL 92.15   Hemoglobin A1c  Result Value Ref Range   Hgb A1c MFr Bld 7.1 (H) 4.6 - 6.5 %  Comprehensive metabolic panel  Result Value Ref Range   Sodium 137 135 - 145 mEq/L   Potassium 3.9 3.5 - 5.1 mEq/L   Chloride 103 96 - 112 mEq/L   CO2 25 19 - 32 mEq/L   Glucose, Bld 149 (H) 70 - 99 mg/dL   BUN 10 6 - 23 mg/dL   Creatinine, Ser 4.69 0.40 - 1.50 mg/dL   Total Bilirubin 0.4 0.2 - 1.2 mg/dL   Alkaline Phosphatase 55 39 - 117 U/L   AST 15 0 - 37 U/L    ALT 16 0 - 53 U/L   Total Protein 7.5 6.0 - 8.3 g/dL   Albumin 4.1 3.5 - 5.2 g/dL   GFR 629.52 >84.13 mL/min   Calcium 9.3  8.4 - 10.5 mg/dL    Assessment and Plan  Hypertension associated with diabetes Assessment & Plan: Stable, chronic.  Continue current medication.  No evidence of orthostatic hypotension so we will continue on lisinopril 10 mg p.o. daily for now as it is beneficial for kidney protection.   Hyperlipidemia associated with type 2 diabetes mellitus Assessment & Plan: Chronic, associated with diabetes LDL now at goal less than 409 on simvastatin 40 mg p.o. p.o. daily   Type 2 diabetes mellitus with other circulatory complications Assessment & Plan:  Chronic, slightly worsened control but patient feels it is due to not adhering to low carbohydrate diet on 2 locations in the last 3 months. He continues to notice weight loss with the semaglutide. Weight will continue semaglutide 2 mg weekly and metformin XL 500 mg daily. He is looking into setting up an eye doctor visit.  Will collect a microalbumin test today. Follow-up in 3 months with point-of-care A1c.     Return in about 3 months (around 10/23/2022) for diabetes follow up wih POC A1C.Marland Kitchen   Kerby Nora, MD

## 2022-07-24 NOTE — Assessment & Plan Note (Signed)
Chronic, slightly worsened control but patient feels it is due to not adhering to low carbohydrate diet on 2 locations in the last 3 months. He continues to notice weight loss with the semaglutide. Weight will continue semaglutide 2 mg weekly and metformin XL 500 mg daily. He is looking into setting up an eye doctor visit.  Will collect a microalbumin test today. Follow-up in 3 months with point-of-care A1c.

## 2022-07-24 NOTE — Assessment & Plan Note (Addendum)
Chronic, associated with diabetes LDL now at goal less than 161 on simvastatin 40 mg p.o. p.o. daily

## 2022-07-29 ENCOUNTER — Encounter: Payer: Self-pay | Admitting: Internal Medicine

## 2022-07-30 ENCOUNTER — Other Ambulatory Visit: Payer: Self-pay

## 2022-07-30 ENCOUNTER — Other Ambulatory Visit (HOSPITAL_COMMUNITY): Payer: Self-pay

## 2022-07-30 ENCOUNTER — Other Ambulatory Visit: Payer: Self-pay | Admitting: Family Medicine

## 2022-07-30 MED ORDER — METFORMIN HCL ER 500 MG PO TB24
500.0000 mg | ORAL_TABLET | Freq: Every day | ORAL | 0 refills | Status: DC
  Filled 2022-07-30 – 2022-10-11 (×2): qty 90, 90d supply, fill #0

## 2022-08-05 ENCOUNTER — Ambulatory Visit (HOSPITAL_BASED_OUTPATIENT_CLINIC_OR_DEPARTMENT_OTHER): Payer: BC Managed Care – PPO | Admitting: Pulmonary Disease

## 2022-08-14 ENCOUNTER — Other Ambulatory Visit: Payer: Self-pay

## 2022-09-04 ENCOUNTER — Ambulatory Visit (AMBULATORY_SURGERY_CENTER): Payer: BC Managed Care – PPO

## 2022-09-04 ENCOUNTER — Other Ambulatory Visit: Payer: Self-pay

## 2022-09-04 VITALS — Ht 71.0 in | Wt 345.0 lb

## 2022-09-04 DIAGNOSIS — Z1211 Encounter for screening for malignant neoplasm of colon: Secondary | ICD-10-CM

## 2022-09-04 MED ORDER — NA SULFATE-K SULFATE-MG SULF 17.5-3.13-1.6 GM/177ML PO SOLN
1.0000 | Freq: Once | ORAL | 0 refills | Status: AC
  Filled 2022-09-04: qty 354, 1d supply, fill #0

## 2022-09-04 NOTE — Progress Notes (Signed)
Pre visit completed via phone call; Patient verified name, DOB, and address;  No egg or soy allergy known to patient;  No issues known to pt with past sedation with any surgeries or procedures; Patient denies ever being told they had issues or difficulty with intubation;  No FH of Malignant Hyperthermia; Pt is not on diet pills; Pt is not on home 02;  Pt is not on blood thinners;  Pt denies issues with constipation;  No A fib or A flutter; Have any cardiac testing pending--NO Pt instructed to use Singlecare.com or GoodRx for a price reduction on prep;   Insurance verified during PV appt=[employer] [place]  Patient's chart reviewed by Cathlyn Parsons CNRA prior to previsit and patient appropriate for the [facility].  Previsit completed and red dot placed by patient's name on their procedure day (on provider's schedule).    Instructions sent to patient via MyChart per patient report that he can print instructions for himself;  Good RX request made in Rx;

## 2022-09-05 ENCOUNTER — Other Ambulatory Visit: Payer: Self-pay | Admitting: Internal Medicine

## 2022-09-05 ENCOUNTER — Encounter: Payer: Self-pay | Admitting: Internal Medicine

## 2022-09-09 ENCOUNTER — Other Ambulatory Visit (HOSPITAL_COMMUNITY): Payer: Self-pay

## 2022-09-18 ENCOUNTER — Other Ambulatory Visit: Payer: Self-pay

## 2022-09-18 ENCOUNTER — Ambulatory Visit (AMBULATORY_SURGERY_CENTER): Payer: BC Managed Care – PPO | Admitting: Internal Medicine

## 2022-09-18 ENCOUNTER — Encounter: Payer: Self-pay | Admitting: Internal Medicine

## 2022-09-18 VITALS — BP 130/85 | HR 62 | Temp 97.5°F | Resp 13 | Ht 71.0 in | Wt 345.0 lb

## 2022-09-18 DIAGNOSIS — Z1211 Encounter for screening for malignant neoplasm of colon: Secondary | ICD-10-CM

## 2022-09-18 DIAGNOSIS — D120 Benign neoplasm of cecum: Secondary | ICD-10-CM

## 2022-09-18 DIAGNOSIS — D123 Benign neoplasm of transverse colon: Secondary | ICD-10-CM | POA: Diagnosis not present

## 2022-09-18 DIAGNOSIS — D125 Benign neoplasm of sigmoid colon: Secondary | ICD-10-CM

## 2022-09-18 DIAGNOSIS — K635 Polyp of colon: Secondary | ICD-10-CM | POA: Diagnosis not present

## 2022-09-18 MED ORDER — SODIUM CHLORIDE 0.9 % IV SOLN
500.0000 mL | INTRAVENOUS | Status: DC
Start: 2022-09-18 — End: 2022-09-18

## 2022-09-18 NOTE — Progress Notes (Signed)
Called to room to assist during endoscopic procedure.  Patient ID and intended procedure confirmed with present staff. Received instructions for my participation in the procedure from the performing physician.  

## 2022-09-18 NOTE — Patient Instructions (Addendum)
Continue present medications. Await pathology results. Repeat colonoscopy is recommended. The colonoscopy date will be determined after pathology results from today's exam become available for review.  Please read over handout about polyps                                                                           YOU HAD AN ENDOSCOPIC PROCEDURE TODAY AT THE [facility]:   Refer to the procedure report that was given to you for any specific questions about what was found during the examination.  If the procedure report does not answer your questions, please call your gastroenterologist to clarify.  If you requested that your care partner not be given the details of your procedure findings, then the procedure report has been included in a sealed envelope for you to review at your convenience later.  YOU SHOULD EXPECT: Some feelings of bloating in the abdomen. Passage of more gas than usual.  Walking can help get rid of the air that was put into your GI tract during the procedure and reduce the bloating. If you had a lower endoscopy (such as a colonoscopy or flexible sigmoidoscopy) you may notice spotting of blood in your stool or on the toilet paper. If you underwent a bowel prep for your procedure, you may not have a normal bowel movement for a few days.  Please Note:  You might notice some irritation and congestion in your nose or some drainage.  This is from the oxygen used during your procedure.  There is no need for concern and it should clear up in a day or so.  SYMPTOMS TO REPORT IMMEDIATELY:  Following lower endoscopy (colonoscopy or flexible sigmoidoscopy):  Excessive amounts of blood in the stool  Significant tenderness or worsening of abdominal pains  Swelling of the abdomen that is new, acute  Fever of 100F or higher  For urgent or emergent issues, a gastroenterologist can be reached at any hour by calling [phone]. Do not use MyChart messaging for urgent concerns.     DIET:  We do recommend a small meal at first, but then you may proceed to your regular diet.  Drink plenty of fluids but you should avoid alcoholic beverages for 24 hours.  ACTIVITY:  You should plan to take it easy for the rest of today and you should NOT DRIVE or use heavy machinery until tomorrow (because of the sedation medicines used during the test).    FOLLOW UP: Our staff will call the number listed on your records the next business day following your procedure.  We will call around 7:15- 8:00 am to check on you and address any questions or concerns that you may have regarding the information given to you following your procedure. If we do not reach you, we will leave a message.     If any biopsies were taken you will be contacted by phone or by letter within the next 1-3 weeks.  Please call us at [phone] if you have not heard about the biopsies in 3 weeks.    SIGNATURES/CONFIDENTIALITY: You and/or your care partner have signed paperwork which will be entered into your electronic medical record.  These signatures attest to the fact   that that the information above on your After Visit Summary has been reviewed and is understood.  Full responsibility of the confidentiality of this discharge information lies with you and/or your care-partner.  

## 2022-09-18 NOTE — Progress Notes (Signed)
Uneventful anesthetic. Report to pacu rn. Vss. Care resumed by rn. 

## 2022-09-18 NOTE — Progress Notes (Signed)
GASTROENTEROLOGY PROCEDURE H&P NOTE   Primary Care Physician: Excell Seltzer, MD    Reason for Procedure:  Colon cancer screening  Plan:    Colonoscopy  Patient is appropriate for endoscopic procedure(s) in the ambulatory ([facility]) setting.  The nature of the procedure, as well as the risks, benefits, and alternatives were carefully and thoroughly reviewed with the patient. Ample time for discussion and questions allowed. The patient understood, was satisfied, and agreed to proceed.     HPI: [name] is a 57 y.o. male who presents for screening colonoscopy.  Medical history as below.  Tolerated the prep.  No recent chest pain or shortness of breath.  No abdominal pain today.  Past Medical History:  Diagnosis Date   Diabetes mellitus without complication (HCC)    on meds   Hyperlipidemia    Hypertension    hospitalization 2006   Pneumonia    hospitalization 2006   Sleep apnea    uses CPAP nightly   Thyroid disease    on meds    Past Surgical History:  Procedure Laterality Date   TONSILLECTOMY  1974    Prior to Admission medications   Medication Sig Start Date End Date Taking? Authorizing Provider  levothyroxine (SYNTHROID) 200 MCG tablet TAKE 1 TABLET BY MOUTH EVERY DAY BEFORE BREAKFAST 01/21/22  Yes Bedsole, Amy E, MD  levothyroxine (SYNTHROID) 50 MCG tablet TAKE 1 TABLET BY MOUTH EVERY DAY 01/21/22  Yes Bedsole, Amy E, MD  lisinopril (ZESTRIL) 10 MG tablet Take 1 tablet (10 mg total) by mouth daily. 05/06/22  Yes Bedsole, Amy E, MD  metFORMIN (GLUCOPHAGE-XR) 500 MG 24 hr tablet Take 1 tablet (500 mg total) by mouth daily with breakfast. 07/30/22  Yes Bedsole, Amy E, MD  simvastatin (ZOCOR) 40 MG tablet Take 1 tablet (40 mg total) by mouth at bedtime. 05/03/22  Yes Bedsole, Amy E, MD  blood glucose meter kit and supplies KIT Dispense based on patient and insurance preference. Use up to four times daily as directed. 08/09/21   Bedsole, Amy E, MD  Semaglutide, 2  MG/DOSE, 8 MG/3ML SOPN Inject 2 mg as directed once a week. 11/06/21   Excell Seltzer, MD    Current Outpatient Medications  Medication Sig Dispense Refill   levothyroxine (SYNTHROID) 200 MCG tablet TAKE 1 TABLET BY MOUTH EVERY DAY BEFORE BREAKFAST 90 tablet 3   levothyroxine (SYNTHROID) 50 MCG tablet TAKE 1 TABLET BY MOUTH EVERY DAY 90 tablet 3   lisinopril (ZESTRIL) 10 MG tablet Take 1 tablet (10 mg total) by mouth daily. 90 tablet 1   metFORMIN (GLUCOPHAGE-XR) 500 MG 24 hr tablet Take 1 tablet (500 mg total) by mouth daily with breakfast. 90 tablet 0   simvastatin (ZOCOR) 40 MG tablet Take 1 tablet (40 mg total) by mouth at bedtime. 90 tablet 1   blood glucose meter kit and supplies KIT Dispense based on patient and insurance preference. Use up to four times daily as directed. 1 each 0   Semaglutide, 2 MG/DOSE, 8 MG/3ML SOPN Inject 2 mg as directed once a week. 3 mL 9   Current Facility-Administered Medications  Medication Dose Route Frequency Provider Last Rate Last Admin   0.9 %  sodium chloride infusion  500 mL Intravenous Continuous Rose Hippler, Carie Caddy, MD        Allergies as of 09/18/2022   (No Known Allergies)    Family History  Problem Relation Age of Onset   Diabetes Mother    Hypertension  Mother    Diabetes Father    Hypertension Father    Heart attack Father 17   Diabetes Paternal Grandmother    Coronary artery disease Other        1st degree male relative <50   Cancer Neg Hx    Colon cancer Neg Hx    Esophageal cancer Neg Hx    Stomach cancer Neg Hx    Rectal cancer Neg Hx     Social History   Socioeconomic History   Marital status: Married    Spouse name: Not on file   Number of children: Not on file   Years of education: Not on file   Highest education level: Not on file  Occupational History   Occupation: lab tech for [employer]  Tobacco Use   Smoking status: Never    Passive exposure: Never   Smokeless tobacco: Never  Vaping Use   Vaping Use: Never used   Substance and Sexual Activity   Alcohol use: Yes    Alcohol/week: 1.0 standard drink of alcohol    Types: 1 Cans of beer per week    Comment: 1/month   Drug use: No   Sexual activity: Not on file  Other Topics Concern   Not on file  Social History Narrative   Healthy children   Regular exercise - no   Diet: eats on the run, some fast food, working on changing   Social Determinants of Health   Financial Resource Strain: Patient Declined (07/23/2022)   Overall Financial Resource Strain (CARDIA)    Difficulty of Paying Living Expenses: Patient declined  Food Insecurity: Patient Declined (07/23/2022)   Hunger Vital Sign    Worried About Running Out of Food in the Last Year: Patient declined    Ran Out of Food in the Last Year: Patient declined  Transportation Needs: Patient Declined (07/23/2022)   PRAPARE - Administrator, Civil Service (Medical): Patient declined    Lack of Transportation (Non-Medical): Patient declined  Physical Activity: Sufficiently Active (07/23/2022)   Exercise Vital Sign    Days of Exercise per Week: 5 days    Minutes of Exercise per Session: 40 min  Stress: No Stress Concern Present (07/23/2022)   [employer] of Occupational Health - Occupational Stress Questionnaire    Feeling of Stress : Not at all  Social Connections: Unknown (07/23/2022)   Social Connection and Isolation Panel [NHANES]    Frequency of Communication with Friends and Family: Patient declined    Frequency of Social Gatherings with Friends and Family: Patient declined    Attends Religious Services: Patient declined    Database administrator or Organizations: Yes    Attends Banker Meetings: Patient declined    Marital Status: Patient declined  Catering manager Violence: Not on file    Physical Exam: Vital signs in last 24 hours: @BP  126/88   Pulse 72   Temp (!) 97.5 F (36.4 C) (Skin)   Ht 5\' 11"  (1.803 m)   Wt (!) 345 lb (156.5 kg)   SpO2 96%   BMI  48.12 kg/m  GEN: NAD EYE: Sclerae anicteric ENT: MMM CV: Non-tachycardic Pulm: CTA b/l GI: Soft, NT/ND NEURO:  Alert & Oriented x 3   Erick Blinks, MD [facility]  09/18/2022 2:43 PM

## 2022-09-18 NOTE — Op Note (Signed)
[facility] Patient Name: [name] Procedure Date: 09/18/2022 2:42 PM MRN: [id number] Endoscopist: Beverley Fiedler , MD, [id number] Age: 57 Referring MD:  Date of Birth: 1965-05-31 Gender: Male Account #: [id number] Procedure:                Colonoscopy Indications:              Screening for colorectal malignant neoplasm, This                            is the patient's first colonoscopy Medicines:                Monitored Anesthesia Care Procedure:                Pre-Anesthesia Assessment:                           - Prior to the procedure, a History and Physical                            was performed, and patient medications and                            allergies were reviewed. The patient's tolerance of                            previous anesthesia was also reviewed. The risks                            and benefits of the procedure and the sedation                            options and risks were discussed with the patient.                            All questions were answered, and informed consent                            was obtained. Prior Anticoagulants: The patient has                            taken no anticoagulant or antiplatelet agents. ASA                            Grade Assessment: III - A patient with severe                            systemic disease. After reviewing the risks and                            benefits, the patient was deemed in satisfactory                            condition to undergo the procedure.  After obtaining informed consent, the colonoscope                            was passed under direct vision. Throughout the                            procedure, the patient's blood pressure, pulse, and                            oxygen saturations were monitored continuously. The                            Olympus CF-HQ190L SN [id number] was introduced through                            the anus and  advanced to the cecum, identified by                            appendiceal orifice and ileocecal valve. The                            colonoscopy was performed without difficulty. The                            patient tolerated the procedure well. The quality                            of the bowel preparation was good. The ileocecal                            valve, appendiceal orifice, and rectum were                            photographed. Scope In: 2:54:28 PM Scope Out: 3:12:08 PM Scope Withdrawal Time: 0 hours 15 minutes 52 seconds  Total Procedure Duration: 0 hours 17 minutes 40 seconds  Findings:                 The digital rectal exam was normal.                           A 4 mm polyp was found in the ileocecal valve. The                            polyp was sessile. The polyp was removed with a                            cold snare. Resection and retrieval were complete.                           Two sessile polyps were found in the transverse                            colon. The polyps were 4 to 6  mm in size. These                            polyps were removed with a cold snare. Resection                            and retrieval were complete.                           A 5 mm polyp was found in the distal sigmoid colon.                            The polyp was sessile. The polyp was removed with a                            cold snare. Resection and retrieval were complete.                           The retroflexed view of the distal rectum and anal                            verge was normal and showed no anal or rectal                            abnormalities. Complications:            No immediate complications. Estimated Blood Loss:     Estimated blood loss was minimal. Impression:               - One 4 mm polyp at the ileocecal valve, removed                            with a cold snare. Resected and retrieved.                           - Two 4 to 6 mm polyps in the  transverse colon,                            removed with a cold snare. Resected and retrieved.                           - One 5 mm polyp in the distal sigmoid colon,                            removed with a cold snare. Resected and retrieved.                           - The distal rectum and anal verge are normal on                            retroflexion view. Recommendation:           - Patient has a contact number available for  emergencies. The signs and symptoms of potential                            delayed complications were discussed with the                            patient. Return to normal activities tomorrow.                            Written discharge instructions were provided to the                            patient.                           - Resume previous diet.                           - Continue present medications.                           - Await pathology results.                           - Repeat colonoscopy is recommended. The                            colonoscopy date will be determined after pathology                            results from today's exam become available for                            review. Beverley Fiedler, MD 09/18/2022 3:14:31 PM This report has been signed electronically.

## 2022-09-18 NOTE — Progress Notes (Signed)
Patient states there have been no changes to medical or surgical history since time of pre-visit. 

## 2022-09-19 ENCOUNTER — Telehealth: Payer: Self-pay

## 2022-09-19 NOTE — Telephone Encounter (Signed)
  Follow up Call-     09/18/2022    1:59 PM  Call back number  Post procedure Call Back phone  # [phone]     Patient questions:  Do you have a fever, pain , or abdominal swelling? No. Pain Score  0 *  Have you tolerated food without any problems? Yes.    Have you been able to return to your normal activities? Yes.    Do you have any questions about your discharge instructions: Diet   No. Medications  No. Follow up visit  No.  Do you have questions or concerns about your Care? No.  Actions: * If pain score is 4 or above: No action needed, pain <4.

## 2022-09-26 ENCOUNTER — Encounter: Payer: Self-pay | Admitting: Internal Medicine

## 2022-10-11 ENCOUNTER — Other Ambulatory Visit: Payer: Self-pay

## 2022-11-04 ENCOUNTER — Encounter: Payer: BC Managed Care – PPO | Admitting: Internal Medicine

## 2022-11-07 ENCOUNTER — Other Ambulatory Visit: Payer: Self-pay | Admitting: Family Medicine

## 2022-11-07 ENCOUNTER — Other Ambulatory Visit (HOSPITAL_COMMUNITY): Payer: Self-pay

## 2022-11-07 ENCOUNTER — Other Ambulatory Visit: Payer: Self-pay

## 2022-11-07 ENCOUNTER — Telehealth: Payer: Self-pay | Admitting: Family Medicine

## 2022-11-07 MED ORDER — LISINOPRIL 10 MG PO TABS
10.0000 mg | ORAL_TABLET | Freq: Every day | ORAL | 1 refills | Status: DC
  Filled 2022-11-07: qty 90, 90d supply, fill #0
  Filled 2023-01-07 – 2023-01-20 (×2): qty 90, 90d supply, fill #1

## 2022-11-07 MED ORDER — LEVOTHYROXINE SODIUM 50 MCG PO TABS
50.0000 ug | ORAL_TABLET | Freq: Every day | ORAL | 0 refills | Status: DC
  Filled 2022-11-07: qty 90, 90d supply, fill #0

## 2022-11-07 MED ORDER — LEVOTHYROXINE SODIUM 200 MCG PO TABS
200.0000 ug | ORAL_TABLET | Freq: Every day | ORAL | 0 refills | Status: DC
  Filled 2022-11-07: qty 90, 90d supply, fill #0

## 2022-11-07 MED ORDER — SIMVASTATIN 40 MG PO TABS
40.0000 mg | ORAL_TABLET | Freq: Every day | ORAL | 1 refills | Status: DC
  Filled 2022-11-07: qty 90, 90d supply, fill #0
  Filled 2023-01-07 – 2023-01-20 (×2): qty 90, 90d supply, fill #1

## 2022-11-07 NOTE — Telephone Encounter (Signed)
Please schedule CPE with fasting labs prior with Dr. Bedsole.  

## 2022-11-07 NOTE — Telephone Encounter (Signed)
CPE schedule 11/26/2022 at 3:20 pm with Dr. Ermalene Searing.

## 2022-11-07 NOTE — Telephone Encounter (Addendum)
I went ahead and sent refills for the levothyroxine to [facility].  If patient calls back,  Please schedule his CPE with fasting labs prior with Dr Ermalene Searing.

## 2022-11-07 NOTE — Telephone Encounter (Signed)
Last levothyroxine refills were sent to [facility] on [address] so once the refills were sent to [facility] any refills that were remaining at [facility] was probably cancelled out.  I ask [name] to call us back to let us know if he needs a refill and what pharmacy he would like this refill to go.  He is also due for his annual physical with fasting labs prior with Dr. Ermalene Searing.  Please schedule when patient calls back.

## 2022-11-07 NOTE — Telephone Encounter (Signed)
Patient contacted the office in regards to medication levothyroxine 200 and 50 mcg. States he was told by [facility] that the rx was cancelled on 4/24, the date of his last office visit. Patient says he cannot remember why this may have been cancelled? Requested a call back in regards to this whenever possible.

## 2022-11-07 NOTE — Telephone Encounter (Signed)
Patient scheduled.

## 2022-11-09 ENCOUNTER — Other Ambulatory Visit: Payer: Self-pay | Admitting: Family Medicine

## 2022-11-09 ENCOUNTER — Other Ambulatory Visit (HOSPITAL_COMMUNITY): Payer: Self-pay

## 2022-11-11 ENCOUNTER — Telehealth: Payer: Self-pay | Admitting: *Deleted

## 2022-11-11 DIAGNOSIS — Z125 Encounter for screening for malignant neoplasm of prostate: Secondary | ICD-10-CM

## 2022-11-11 DIAGNOSIS — E1169 Type 2 diabetes mellitus with other specified complication: Secondary | ICD-10-CM

## 2022-11-11 DIAGNOSIS — E039 Hypothyroidism, unspecified: Secondary | ICD-10-CM

## 2022-11-11 DIAGNOSIS — E1159 Type 2 diabetes mellitus with other circulatory complications: Secondary | ICD-10-CM

## 2022-11-11 NOTE — Telephone Encounter (Signed)
-----   Message from Vincenza Hews sent at 11/11/2022  2:36 PM EDT ----- Pt is scheduled for labs 8/22. Can we get labs ordered please.   Thanks

## 2022-11-21 ENCOUNTER — Other Ambulatory Visit: Payer: BC Managed Care – PPO

## 2022-11-22 ENCOUNTER — Other Ambulatory Visit: Payer: Self-pay | Admitting: Family Medicine

## 2022-11-22 DIAGNOSIS — E1159 Type 2 diabetes mellitus with other circulatory complications: Secondary | ICD-10-CM

## 2022-11-22 MED ORDER — OZEMPIC (2 MG/DOSE) 8 MG/3ML ~~LOC~~ SOPN
2.0000 mg | PEN_INJECTOR | SUBCUTANEOUS | 0 refills | Status: DC
Start: 2022-11-22 — End: 2022-12-15
  Filled 2022-11-22: qty 3, 28d supply, fill #0

## 2022-11-22 NOTE — Telephone Encounter (Signed)
Please call and schedule Diabetes follow up with Dr. Ermalene Searing.  He was suppose to follow up in July.

## 2022-11-23 ENCOUNTER — Other Ambulatory Visit (HOSPITAL_COMMUNITY): Payer: Self-pay

## 2022-11-25 ENCOUNTER — Other Ambulatory Visit (HOSPITAL_COMMUNITY): Payer: Self-pay

## 2022-11-25 NOTE — Telephone Encounter (Signed)
Lvmtcb, sent mychart message  

## 2022-11-26 ENCOUNTER — Encounter: Payer: BC Managed Care – PPO | Admitting: Family Medicine

## 2022-12-15 ENCOUNTER — Other Ambulatory Visit: Payer: Self-pay | Admitting: Family Medicine

## 2022-12-15 DIAGNOSIS — E1159 Type 2 diabetes mellitus with other circulatory complications: Secondary | ICD-10-CM

## 2022-12-16 NOTE — Telephone Encounter (Signed)
Last office visit 07/24/22 for DM/HTN.  [facility] states to follow up 10/23/22.  We have called and sent a MyChart message to schedule office visit.  No future appointments.  Refill?

## 2022-12-17 ENCOUNTER — Other Ambulatory Visit (HOSPITAL_COMMUNITY): Payer: Self-pay

## 2022-12-17 MED ORDER — OZEMPIC (2 MG/DOSE) 8 MG/3ML ~~LOC~~ SOPN
2.0000 mg | PEN_INJECTOR | SUBCUTANEOUS | 0 refills | Status: DC
  Filled 2022-12-17 – 2022-12-23 (×3): qty 3, 28d supply, fill #0

## 2022-12-18 ENCOUNTER — Other Ambulatory Visit (HOSPITAL_COMMUNITY): Payer: Self-pay

## 2022-12-23 ENCOUNTER — Other Ambulatory Visit (HOSPITAL_COMMUNITY): Payer: Self-pay

## 2022-12-23 ENCOUNTER — Other Ambulatory Visit: Payer: Self-pay

## 2023-01-07 ENCOUNTER — Other Ambulatory Visit: Payer: Self-pay

## 2023-01-07 ENCOUNTER — Other Ambulatory Visit: Payer: Self-pay | Admitting: Family Medicine

## 2023-01-07 DIAGNOSIS — E1159 Type 2 diabetes mellitus with other circulatory complications: Secondary | ICD-10-CM

## 2023-01-07 NOTE — Telephone Encounter (Signed)
Please call patient needs to have cpe and labs scheduled once done will send to provider for review.

## 2023-01-08 NOTE — Telephone Encounter (Signed)
Spoke to pt, scheduled cpe for 02/13/23

## 2023-01-09 ENCOUNTER — Other Ambulatory Visit (HOSPITAL_COMMUNITY): Payer: Self-pay

## 2023-01-09 MED ORDER — METFORMIN HCL ER 500 MG PO TB24
500.0000 mg | ORAL_TABLET | Freq: Every day | ORAL | 0 refills | Status: DC
  Filled 2023-01-09: qty 90, 90d supply, fill #0

## 2023-01-09 MED ORDER — LEVOTHYROXINE SODIUM 200 MCG PO TABS
200.0000 ug | ORAL_TABLET | Freq: Every day | ORAL | 0 refills | Status: DC
  Filled 2023-01-09 – 2023-01-20 (×2): qty 90, 90d supply, fill #0

## 2023-01-09 MED ORDER — LEVOTHYROXINE SODIUM 50 MCG PO TABS
50.0000 ug | ORAL_TABLET | Freq: Every day | ORAL | 0 refills | Status: DC
  Filled 2023-01-09 – 2023-01-20 (×2): qty 90, 90d supply, fill #0

## 2023-01-09 MED ORDER — OZEMPIC (2 MG/DOSE) 8 MG/3ML ~~LOC~~ SOPN
2.0000 mg | PEN_INJECTOR | SUBCUTANEOUS | 0 refills | Status: DC
Start: 2023-01-09 — End: 2023-03-06
  Filled 2023-01-09 – 2023-01-20 (×2): qty 3, 28d supply, fill #0

## 2023-01-10 ENCOUNTER — Other Ambulatory Visit (HOSPITAL_COMMUNITY): Payer: Self-pay

## 2023-01-10 ENCOUNTER — Other Ambulatory Visit: Payer: Self-pay

## 2023-01-20 ENCOUNTER — Other Ambulatory Visit (HOSPITAL_COMMUNITY): Payer: Self-pay

## 2023-01-20 ENCOUNTER — Other Ambulatory Visit: Payer: Self-pay | Admitting: Family Medicine

## 2023-02-06 ENCOUNTER — Other Ambulatory Visit (INDEPENDENT_AMBULATORY_CARE_PROVIDER_SITE_OTHER): Payer: BC Managed Care – PPO

## 2023-02-06 DIAGNOSIS — E119 Type 2 diabetes mellitus without complications: Secondary | ICD-10-CM | POA: Diagnosis not present

## 2023-02-06 DIAGNOSIS — E785 Hyperlipidemia, unspecified: Secondary | ICD-10-CM

## 2023-02-06 DIAGNOSIS — E039 Hypothyroidism, unspecified: Secondary | ICD-10-CM

## 2023-02-06 DIAGNOSIS — E1169 Type 2 diabetes mellitus with other specified complication: Secondary | ICD-10-CM | POA: Diagnosis not present

## 2023-02-06 DIAGNOSIS — Z125 Encounter for screening for malignant neoplasm of prostate: Secondary | ICD-10-CM | POA: Diagnosis not present

## 2023-02-06 DIAGNOSIS — E1159 Type 2 diabetes mellitus with other circulatory complications: Secondary | ICD-10-CM

## 2023-02-06 LAB — MICROALBUMIN / CREATININE URINE RATIO
Creatinine,U: 103 mg/dL
Microalb Creat Ratio: 0.7 mg/g (ref 0.0–30.0)
Microalb, Ur: <0.7 mg/dL (ref 0.0–1.9)

## 2023-02-06 LAB — COMPREHENSIVE METABOLIC PANEL
ALT: 15 U/L (ref 0–53)
AST: 14 U/L (ref 0–37)
Albumin: 4.1 g/dL (ref 3.5–5.2)
Alkaline Phosphatase: 65 U/L (ref 39–117)
BUN: 9 mg/dL (ref 6–23)
CO2: 28 meq/L (ref 19–32)
Calcium: 9.5 mg/dL (ref 8.4–10.5)
Chloride: 101 meq/L (ref 96–112)
Creatinine, Ser: 0.74 mg/dL (ref 0.40–1.50)
GFR: 102.16 mL/min (ref >60.00)
Glucose, Bld: 118 mg/dL — ABNORMAL HIGH (ref 70–99)
Potassium: 4.3 meq/L (ref 3.5–5.1)
Sodium: 138 meq/L (ref 135–145)
Total Bilirubin: 0.7 mg/dL (ref 0.2–1.2)
Total Protein: 7.2 g/dL (ref 6.0–8.3)

## 2023-02-06 LAB — LIPID PANEL
Cholesterol: 138 mg/dL (ref 0–200)
HDL: 40.7 mg/dL (ref >39.00)
LDL Cholesterol: 77 mg/dL (ref 0–99)
NonHDL: 96.91
Total CHOL/HDL Ratio: 3
Triglycerides: 100 mg/dL (ref 0.0–149.0)
VLDL: 20 mg/dL (ref 0.0–40.0)

## 2023-02-06 LAB — T4, FREE: Free T4: 1.17 ng/dL (ref 0.60–1.60)

## 2023-02-06 LAB — PSA: PSA: 0.2 ng/mL (ref 0.10–4.00)

## 2023-02-06 LAB — HEMOGLOBIN A1C: Hgb A1c MFr Bld: 6.7 % — ABNORMAL HIGH (ref 4.6–6.5)

## 2023-02-06 LAB — T3, FREE: T3, Free: 3.3 pg/mL (ref 2.3–4.2)

## 2023-02-06 LAB — TSH: TSH: 0.72 u[IU]/mL (ref 0.35–5.50)

## 2023-02-06 NOTE — Progress Notes (Signed)
No critical labs need to be addressed urgently. We will discuss labs in detail at upcoming office visit.   

## 2023-02-07 NOTE — Progress Notes (Signed)
No critical labs need to be addressed urgently. We will discuss labs in detail at upcoming office visit.   

## 2023-02-13 ENCOUNTER — Encounter: Payer: BC Managed Care – PPO | Admitting: Family Medicine

## 2023-02-18 ENCOUNTER — Other Ambulatory Visit (HOSPITAL_COMMUNITY): Payer: Self-pay

## 2023-03-06 ENCOUNTER — Other Ambulatory Visit: Payer: Self-pay | Admitting: Family Medicine

## 2023-03-06 ENCOUNTER — Other Ambulatory Visit (HOSPITAL_COMMUNITY): Payer: Self-pay

## 2023-03-06 ENCOUNTER — Other Ambulatory Visit: Payer: Self-pay

## 2023-03-06 DIAGNOSIS — E1159 Type 2 diabetes mellitus with other circulatory complications: Secondary | ICD-10-CM

## 2023-03-06 MED ORDER — OZEMPIC (2 MG/DOSE) 8 MG/3ML ~~LOC~~ SOPN
2.0000 mg | PEN_INJECTOR | SUBCUTANEOUS | 0 refills | Status: DC
  Filled 2023-03-06: qty 3, 28d supply, fill #0

## 2023-03-11 ENCOUNTER — Encounter: Payer: Self-pay | Admitting: Family Medicine

## 2023-03-11 ENCOUNTER — Ambulatory Visit: Payer: BC Managed Care – PPO | Admitting: Family Medicine

## 2023-03-11 ENCOUNTER — Ambulatory Visit (INDEPENDENT_AMBULATORY_CARE_PROVIDER_SITE_OTHER)
Admission: RE | Admit: 2023-03-11 | Discharge: 2023-03-11 | Disposition: A | Discharge status: Home / Self Care | Payer: BC Managed Care – PPO | Source: Ambulatory Visit | Attending: Family Medicine | Admitting: Family Medicine

## 2023-03-11 VITALS — BP 102/60 | HR 85 | Temp 97.5°F | Ht 70.0 in | Wt 332.1 lb

## 2023-03-11 DIAGNOSIS — I152 Hypertension secondary to endocrine disorders: Secondary | ICD-10-CM

## 2023-03-11 DIAGNOSIS — Z0000 Encounter for general adult medical examination without abnormal findings: Secondary | ICD-10-CM

## 2023-03-11 DIAGNOSIS — E039 Hypothyroidism, unspecified: Secondary | ICD-10-CM

## 2023-03-11 DIAGNOSIS — E1159 Type 2 diabetes mellitus with other circulatory complications: Secondary | ICD-10-CM

## 2023-03-11 DIAGNOSIS — G8929 Other chronic pain: Secondary | ICD-10-CM | POA: Diagnosis not present

## 2023-03-11 DIAGNOSIS — M25562 Pain in left knee: Secondary | ICD-10-CM

## 2023-03-11 DIAGNOSIS — Z7985 Long-term (current) use of injectable non-insulin antidiabetic drugs: Secondary | ICD-10-CM

## 2023-03-11 DIAGNOSIS — E785 Hyperlipidemia, unspecified: Secondary | ICD-10-CM

## 2023-03-11 DIAGNOSIS — Z7984 Long term (current) use of oral hypoglycemic drugs: Secondary | ICD-10-CM

## 2023-03-11 DIAGNOSIS — E1169 Type 2 diabetes mellitus with other specified complication: Secondary | ICD-10-CM

## 2023-03-11 DIAGNOSIS — G4733 Obstructive sleep apnea (adult) (pediatric): Secondary | ICD-10-CM

## 2023-03-11 LAB — HM DIABETES FOOT EXAM

## 2023-03-11 NOTE — Assessment & Plan Note (Signed)
Chronic, worsening Pain most after sitting a while and then going to stand, minimal when dancing or moving. Possible osteoarthritis versus patellofemoral syndrome. Will evaluate with plain x-ray Can start glucosamine 500 mg p.o. daily to 3 times daily. Info given on home physical therapy.

## 2023-03-11 NOTE — Assessment & Plan Note (Signed)
He has lost 22 pounds to date since initiation of semaglutide.   Encouraged exercise, weight loss, healthy eating habits.

## 2023-03-11 NOTE — Assessment & Plan Note (Signed)
Reviewed last note from Dr. Vassie Loll pulmonary  05/2022

## 2023-03-11 NOTE — Assessment & Plan Note (Signed)
Chronic,   Semaglutide 2 mg weekly and metformin XL 500 mg daily.  Eye exam due.  Foot exam performed today.  Associated with HTN.

## 2023-03-11 NOTE — Assessment & Plan Note (Signed)
Chronic, well-controlled on high-dose levothyroxine 250 mcg daily.

## 2023-03-11 NOTE — Assessment & Plan Note (Signed)
Chronic, associated with diabetes LDL now at goal less than 161 on simvastatin 40 mg p.o. p.o. daily

## 2023-03-11 NOTE — Patient Instructions (Addendum)
Can try glucosamine 500 mg 1-3 times daily.  Start home exercises for knee.  Can use cho pat strap.  Will call with X-ray results.

## 2023-03-11 NOTE — Progress Notes (Signed)
Patient ID: [name], male    DOB: 12/28/1965, 57 y.o.   MRN: [id number]  This visit was conducted in person.  BP 102/60 (BP Location: Left Arm, Patient Position: Sitting, Cuff Size: Large)   Pulse 85   Temp (!) 97.5 F (36.4 C) (Temporal)   Ht 5\' 10"  (1.778 m)   Wt (!) 332 lb 2 oz (150.7 kg)   SpO2 95%   BMI 47.65 kg/m    CC:  Chief Complaint  Patient presents with   Annual Exam    Subjective:   HPI: [name] is a 57 y.o. male presenting on 03/11/2023 for Annual Exam The patient presents for complete physical and review of chronic health problems. He/She also has the following acute concerns today: IN last year gradully worsening left knee pain.Marland Kitchen no swelling.  Mainly going sitting to standing.. feel like it could give way.  Sore after driving or sitting a while.  No known recent injury but many sports injuries years ago.  Voltaren gel several times a day did not help., some better with dual action advil.   Hypertension:  Significant improvement with weight loss on lisinopril 10 mg daily BP Readings from Last 3 Encounters:  03/11/23 102/60  09/18/22 130/85  07/24/22 116/62  Using medication without problems or lightheadedness:  none Chest pain with exertion: none Edema: none Short of breath: none Average home BPs: 105/65 Other issues:  Elevated Cholesterol: LDL almost at goal less than 100 on simvastatin 40 mg daily Lab Results  Component Value Date   CHOL 138 02/06/2023   HDL 40.70 02/06/2023   LDLCALC 77 02/06/2023   LDLDIRECT 150.4 05/19/2007   TRIG 100.0 02/06/2023   CHOLHDL 3 02/06/2023  Using medications without problems: none Muscle aches:  none Diet compliance:  good Exercise: outdoor work Other complaints:  Diabetes: Improvement  on semaglutide 2 mg weekly No abd pain, no nausea. Also taking metformin 500 mg XR 1 tablet daily Lab Results  Component Value Date   HGBA1C 6.7 (H) 02/06/2023  Using medications without  difficulties: Hypoglycemic episodes: none Hyperglycemic episodes: none Feet problems: no ulcers Blood Sugars averaging: FBS 100 eye exam within last year: due  Morbid obesity: He has lost 10 lbs in the last 6 months. Wt Readings from Last 3 Encounters:  03/11/23 (!) 332 lb 2 oz (150.7 kg)  09/18/22 (!) 345 lb (156.5 kg)  09/04/22 (!) 345 lb (156.5 kg)  Body mass index is 47.65 kg/m.   Hypothyroidism:  levothyroxine 200 and a 50 mcg daily. Lab Results  Component Value Date   TSH 0.72 02/06/2023   Lab Results  Component Value Date   CHOL 138 02/06/2023   HDL 40.70 02/06/2023   LDLCALC 77 02/06/2023   LDLDIRECT 150.4 05/19/2007   TRIG 100.0 02/06/2023   CHOLHDL 3 02/06/2023        Relevant past medical, surgical, family and social history reviewed and updated as indicated. Interim medical history since our last visit reviewed. Allergies and medications reviewed and updated. Outpatient Medications Prior to Visit  Medication Sig Dispense Refill   blood glucose meter kit and supplies KIT Dispense based on patient and insurance preference. Use up to four times daily as directed. 1 each 0   levothyroxine (SYNTHROID) 200 MCG tablet Take 1 tablet (200 mcg total) by mouth daily before breakfast. 90 tablet 0   levothyroxine (SYNTHROID) 50 MCG tablet Take 1 tablet (50 mcg total) by mouth daily. 90 tablet 0  lisinopril (ZESTRIL) 10 MG tablet Take 1 tablet (10 mg total) by mouth daily. 90 tablet 1   metFORMIN (GLUCOPHAGE-XR) 500 MG 24 hr tablet Take 1 tablet (500 mg total) by mouth daily with breakfast. 90 tablet 0   Semaglutide, 2 MG/DOSE, (OZEMPIC, 2 MG/DOSE,) 8 MG/3ML SOPN Inject 2 mg as directed once a week. 3 mL 0   simvastatin (ZOCOR) 40 MG tablet Take 1 tablet (40 mg total) by mouth at bedtime. 90 tablet 1   No facility-administered medications prior to visit.     Per HPI unless specifically indicated in ROS section below Review of Systems  Constitutional:  Negative for  fatigue and fever.  HENT:  Negative for ear pain.   Eyes:  Negative for pain.  Respiratory:  Negative for cough and shortness of breath.   Cardiovascular:  Negative for chest pain, palpitations and leg swelling.  Gastrointestinal:  Negative for abdominal pain.  Genitourinary:  Negative for dysuria.  Musculoskeletal:  Negative for arthralgias.  Neurological:  Negative for syncope, light-headedness and headaches.  Psychiatric/Behavioral:  Negative for dysphoric mood.    Objective:  BP 102/60 (BP Location: Left Arm, Patient Position: Sitting, Cuff Size: Large)   Pulse 85   Temp (!) 97.5 F (36.4 C) (Temporal)   Ht 5\' 10"  (1.778 m)   Wt (!) 332 lb 2 oz (150.7 kg)   SpO2 95%   BMI 47.65 kg/m   Wt Readings from Last 3 Encounters:  03/11/23 (!) 332 lb 2 oz (150.7 kg)  09/18/22 (!) 345 lb (156.5 kg)  09/04/22 (!) 345 lb (156.5 kg)      Physical Exam Constitutional:      Appearance: He is well-developed.  HENT:     Head: Normocephalic.     Right Ear: Hearing normal.     Left Ear: Hearing normal.     Nose: Nose normal.  Neck:     Thyroid: No thyroid mass or thyromegaly.     Vascular: No carotid bruit.     Trachea: Trachea normal.  Cardiovascular:     Rate and Rhythm: Normal rate and regular rhythm.     Pulses: Normal pulses.     Heart sounds: Heart sounds not distant. No murmur heard.    No friction rub. No gallop.     Comments: No peripheral edema Pulmonary:     Effort: Pulmonary effort is normal. No respiratory distress.     Breath sounds: Normal breath sounds.  Musculoskeletal:     Right knee: No tenderness.     Left knee: Crepitus present. No bony tenderness. Normal range of motion. No tenderness.  Skin:    General: Skin is warm and dry.     Findings: No rash.  Psychiatric:        Speech: Speech normal.        Behavior: Behavior normal.        Thought Content: Thought content normal.       Results for orders placed or performed in visit on 03/11/23  HM DIABETES  FOOT EXAM  Result Value Ref Range   HM Diabetic Foot Exam done      COVID 19 screen:  No recent travel or known exposure to COVID19 The patient denies respiratory symptoms of COVID 19 at this time. The importance of social distancing was discussed today.   Assessment and Plan   The patient's preventative maintenance and recommended screening tests for an annual wellness exam were reviewed in full today. Brought up to date unless services  declined.  Counselled on the importance of diet, exercise, and its role in overall health and mortality. The patient's FH and SH was reviewed, including their home life, tobacco status, and drug and alcohol status.   Vaccines: Uptodate except refused COVID, considering Shingrix Prostate Cancer Screen: no early family history  Lab Results  Component Value Date   PSA 0.20 02/06/2023   PSA 0.15 11/01/2021   PSA 0.24 11/10/2020  Colon Cancer Screen:  08/2022 repeat in 3 years, Pyrtle      Smoking Status: none ETOH/ drug use: none/none   HIV screen:  Refused.    EYE exam schedule 04/2023   Problem List Items Addressed This Visit     Chronic pain of left knee    Chronic, worsening Pain most after sitting a while and then going to stand, minimal when dancing or moving. Possible osteoarthritis versus patellofemoral syndrome. Will evaluate with plain x-ray Can start glucosamine 500 mg p.o. daily to 3 times daily. Info given on home physical therapy.      Relevant Orders   DG Knee 4 Views W/Patella Left   Hyperlipidemia associated with type 2 diabetes mellitus (HCC)    Chronic, associated with diabetes LDL now at goal less than 324 on simvastatin 40 mg p.o. p.o. daily      Hypertension associated with diabetes (HCC)    Stable, chronic.  Continue current medication.  No evidence of orthostatic hypotension so we will continue on lisinopril 10 mg p.o. daily for now as it is beneficial for kidney protection.      Hypothyroidism    Chronic,  well-controlled on high-dose levothyroxine 250 mcg daily.      Morbid obesity (HCC)    He has lost 22 pounds to date since initiation of semaglutide.   Encouraged exercise, weight loss, healthy eating habits.       Obstructive sleep apnea syndrome    Reviewed last note from Dr. Vassie Loll pulmonary  05/2022      Type 2 diabetes mellitus with other circulatory complications (HCC)     Chronic,   Semaglutide 2 mg weekly and metformin XL 500 mg daily.  Eye exam due.  Foot exam performed today.  Associated with HTN.      Other Visit Diagnoses     Routine general medical examination at a health care facility    -  Primary          Kerby Nora, MD

## 2023-03-11 NOTE — Assessment & Plan Note (Signed)
Stable, chronic.  Continue current medication.  No evidence of orthostatic hypotension so we will continue on lisinopril 10 mg p.o. daily for now as it is beneficial for kidney protection.

## 2023-03-12 ENCOUNTER — Encounter: Payer: Self-pay | Admitting: Family Medicine

## 2023-03-12 DIAGNOSIS — M25762 Osteophyte, left knee: Secondary | ICD-10-CM | POA: Insufficient documentation

## 2023-03-31 ENCOUNTER — Other Ambulatory Visit: Payer: Self-pay | Admitting: Family Medicine

## 2023-03-31 ENCOUNTER — Other Ambulatory Visit: Payer: Self-pay

## 2023-03-31 ENCOUNTER — Other Ambulatory Visit (HOSPITAL_COMMUNITY): Payer: Self-pay

## 2023-03-31 DIAGNOSIS — E1159 Type 2 diabetes mellitus with other circulatory complications: Secondary | ICD-10-CM

## 2023-03-31 MED ORDER — LEVOTHYROXINE SODIUM 50 MCG PO TABS
50.0000 ug | ORAL_TABLET | Freq: Every day | ORAL | 3 refills | Status: AC
  Filled 2023-03-31: qty 90, 90d supply, fill #0
  Filled 2023-07-25: qty 90, 90d supply, fill #1
  Filled 2023-10-24: qty 90, 90d supply, fill #2
  Filled 2024-02-19: qty 90, 90d supply, fill #3

## 2023-03-31 MED ORDER — OZEMPIC (2 MG/DOSE) 8 MG/3ML ~~LOC~~ SOPN
2.0000 mg | PEN_INJECTOR | SUBCUTANEOUS | 5 refills | Status: DC
  Filled 2023-03-31: qty 3, 28d supply, fill #0
  Filled 2023-04-26: qty 3, 28d supply, fill #1
  Filled 2023-05-26: qty 3, 28d supply, fill #2
  Filled 2023-06-24: qty 3, 28d supply, fill #3
  Filled 2023-07-25: qty 3, 28d supply, fill #4
  Filled 2023-09-23: qty 3, 28d supply, fill #5

## 2023-03-31 MED ORDER — LISINOPRIL 10 MG PO TABS
10.0000 mg | ORAL_TABLET | Freq: Every day | ORAL | 3 refills | Status: AC
  Filled 2023-03-31: qty 90, 90d supply, fill #0
  Filled 2023-07-25: qty 90, 90d supply, fill #1
  Filled 2023-10-24: qty 90, 90d supply, fill #2
  Filled 2024-02-19: qty 90, 90d supply, fill #3

## 2023-03-31 MED ORDER — SIMVASTATIN 40 MG PO TABS
40.0000 mg | ORAL_TABLET | Freq: Every day | ORAL | 3 refills | Status: AC
  Filled 2023-03-31: qty 90, 90d supply, fill #0
  Filled 2023-07-25: qty 90, 90d supply, fill #1
  Filled 2023-10-24: qty 90, 90d supply, fill #2
  Filled 2024-02-19: qty 90, 90d supply, fill #3

## 2023-03-31 MED ORDER — METFORMIN HCL ER 500 MG PO TB24
500.0000 mg | ORAL_TABLET | Freq: Every day | ORAL | 1 refills | Status: DC
  Filled 2023-03-31: qty 90, 90d supply, fill #0
  Filled 2023-07-25: qty 90, 90d supply, fill #1

## 2023-03-31 MED ORDER — LEVOTHYROXINE SODIUM 200 MCG PO TABS
200.0000 ug | ORAL_TABLET | Freq: Every day | ORAL | 3 refills | Status: AC
  Filled 2023-03-31: qty 90, 90d supply, fill #0
  Filled 2023-07-25: qty 90, 90d supply, fill #1
  Filled 2023-10-24: qty 90, 90d supply, fill #2
  Filled 2024-02-19: qty 90, 90d supply, fill #3

## 2023-04-26 ENCOUNTER — Other Ambulatory Visit (HOSPITAL_COMMUNITY): Payer: Self-pay

## 2023-05-26 ENCOUNTER — Other Ambulatory Visit (HOSPITAL_COMMUNITY): Payer: Self-pay

## 2023-06-24 ENCOUNTER — Other Ambulatory Visit (HOSPITAL_COMMUNITY): Payer: Self-pay

## 2023-07-25 ENCOUNTER — Other Ambulatory Visit (HOSPITAL_COMMUNITY): Payer: Self-pay

## 2023-09-23 ENCOUNTER — Other Ambulatory Visit: Payer: Self-pay

## 2023-10-24 ENCOUNTER — Other Ambulatory Visit: Payer: Self-pay

## 2023-10-24 ENCOUNTER — Other Ambulatory Visit (HOSPITAL_COMMUNITY): Payer: Self-pay

## 2023-10-24 ENCOUNTER — Other Ambulatory Visit: Payer: Self-pay | Admitting: Family Medicine

## 2023-10-24 DIAGNOSIS — E1159 Type 2 diabetes mellitus with other circulatory complications: Secondary | ICD-10-CM

## 2023-10-24 MED ORDER — METFORMIN HCL ER 500 MG PO TB24
500.0000 mg | ORAL_TABLET | Freq: Every day | ORAL | 0 refills | Status: DC
  Filled 2023-10-24: qty 90, 90d supply, fill #0

## 2023-10-24 MED ORDER — OZEMPIC (2 MG/DOSE) 8 MG/3ML ~~LOC~~ SOPN
2.0000 mg | PEN_INJECTOR | SUBCUTANEOUS | 0 refills | Status: DC
  Filled 2023-10-24: qty 3, 28d supply, fill #0

## 2023-10-24 NOTE — Telephone Encounter (Signed)
 Spoke to patient representative states she will have him call back and schedule follow up.

## 2023-10-24 NOTE — Telephone Encounter (Signed)
 Please call and schedule diabetes follow up with Dr. Avelina.  He was suppose to follow up in June.

## 2023-10-28 LAB — HM DIABETES EYE EXAM

## 2024-02-19 ENCOUNTER — Other Ambulatory Visit: Payer: Self-pay | Admitting: Family Medicine

## 2024-02-20 ENCOUNTER — Other Ambulatory Visit: Payer: Self-pay

## 2024-02-20 ENCOUNTER — Other Ambulatory Visit (HOSPITAL_COMMUNITY): Payer: Self-pay

## 2024-02-20 MED ORDER — METFORMIN HCL ER 500 MG PO TB24
500.0000 mg | ORAL_TABLET | Freq: Every day | ORAL | 0 refills | Status: DC
  Filled 2024-02-20: qty 30, 30d supply, fill #0

## 2024-02-20 NOTE — Telephone Encounter (Signed)
 Please schedule CPE with fasting labs prior with Dr. Avelina after 03/11/2023 or at least a diabetes follow up.   He was suppose to follow up on his diabetes back in June.

## 2024-02-24 ENCOUNTER — Telehealth: Payer: Self-pay | Admitting: *Deleted

## 2024-02-24 DIAGNOSIS — Z125 Encounter for screening for malignant neoplasm of prostate: Secondary | ICD-10-CM

## 2024-02-24 DIAGNOSIS — E1169 Type 2 diabetes mellitus with other specified complication: Secondary | ICD-10-CM

## 2024-02-24 DIAGNOSIS — E039 Hypothyroidism, unspecified: Secondary | ICD-10-CM

## 2024-02-24 DIAGNOSIS — E1159 Type 2 diabetes mellitus with other circulatory complications: Secondary | ICD-10-CM

## 2024-02-24 NOTE — Telephone Encounter (Signed)
-----   Message from Harlene Du sent at 02/24/2024  4:29 PM EST ----- Regarding: Labs Fri 03/05/24 Hello,  Patient is coming in for CPE labs. Can we get orders please.   Thanks

## 2024-03-05 ENCOUNTER — Ambulatory Visit: Payer: Self-pay | Admitting: Family Medicine

## 2024-03-05 ENCOUNTER — Other Ambulatory Visit: Payer: Self-pay

## 2024-03-05 DIAGNOSIS — E1169 Type 2 diabetes mellitus with other specified complication: Secondary | ICD-10-CM

## 2024-03-05 DIAGNOSIS — E039 Hypothyroidism, unspecified: Secondary | ICD-10-CM | POA: Diagnosis not present

## 2024-03-05 DIAGNOSIS — E1159 Type 2 diabetes mellitus with other circulatory complications: Secondary | ICD-10-CM

## 2024-03-05 DIAGNOSIS — E785 Hyperlipidemia, unspecified: Secondary | ICD-10-CM

## 2024-03-05 DIAGNOSIS — Z125 Encounter for screening for malignant neoplasm of prostate: Secondary | ICD-10-CM

## 2024-03-05 LAB — COMPREHENSIVE METABOLIC PANEL WITH GFR
ALT: 39 U/L (ref 0–53)
AST: 30 U/L (ref 0–37)
Albumin: 3.8 g/dL (ref 3.5–5.2)
Alkaline Phosphatase: 58 U/L (ref 39–117)
BUN: 11 mg/dL (ref 6–23)
CO2: 29 meq/L (ref 19–32)
Calcium: 9 mg/dL (ref 8.4–10.5)
Chloride: 98 meq/L (ref 96–112)
Creatinine, Ser: 0.7 mg/dL (ref 0.40–1.50)
GFR: 103.1 mL/min (ref >60.00)
Glucose, Bld: 220 mg/dL — ABNORMAL HIGH (ref 70–99)
Potassium: 4.4 meq/L (ref 3.5–5.1)
Sodium: 134 meq/L — ABNORMAL LOW (ref 135–145)
Total Bilirubin: 0.6 mg/dL (ref 0.2–1.2)
Total Protein: 6.6 g/dL (ref 6.0–8.3)

## 2024-03-05 LAB — MICROALBUMIN / CREATININE URINE RATIO
Creatinine,U: 73.3 mg/dL
Microalb Creat Ratio: UNDETERMINED mg/g (ref 0.0–30.0)
Microalb, Ur: <0.7 mg/dL

## 2024-03-05 LAB — HEMOGLOBIN A1C: Hgb A1c MFr Bld: 9.6 % — ABNORMAL HIGH (ref 4.6–6.5)

## 2024-03-05 LAB — LIPID PANEL
Cholesterol: 185 mg/dL (ref 0–200)
HDL: 45.2 mg/dL (ref >39.00)
LDL Cholesterol: 118 mg/dL — ABNORMAL HIGH (ref 0–99)
NonHDL: 140.04
Total CHOL/HDL Ratio: 4
Triglycerides: 111 mg/dL (ref 0.0–149.0)
VLDL: 22.2 mg/dL (ref 0.0–40.0)

## 2024-03-05 LAB — PSA: PSA: 0.16 ng/mL (ref 0.10–4.00)

## 2024-03-05 LAB — T3, FREE: T3, Free: 2.6 pg/mL (ref 2.3–4.2)

## 2024-03-05 LAB — TSH: TSH: 7.72 u[IU]/mL — ABNORMAL HIGH (ref 0.35–5.50)

## 2024-03-05 LAB — T4, FREE: Free T4: 0.79 ng/dL (ref 0.60–1.60)

## 2024-03-05 NOTE — Progress Notes (Signed)
 No critical labs need to be addressed urgently. We will discuss labs in detail at upcoming office visit.

## 2024-03-11 ENCOUNTER — Encounter: Payer: Self-pay | Admitting: Family Medicine

## 2024-03-11 ENCOUNTER — Other Ambulatory Visit: Payer: Self-pay | Admitting: Family Medicine

## 2024-03-11 ENCOUNTER — Other Ambulatory Visit: Payer: Self-pay

## 2024-03-11 ENCOUNTER — Ambulatory Visit: Admitting: Family Medicine

## 2024-03-11 ENCOUNTER — Other Ambulatory Visit (HOSPITAL_COMMUNITY): Payer: Self-pay

## 2024-03-11 VITALS — BP 126/82 | HR 74 | Temp 98.0°F | Ht 70.0 in | Wt 362.0 lb

## 2024-03-11 DIAGNOSIS — Z7985 Long-term (current) use of injectable non-insulin antidiabetic drugs: Secondary | ICD-10-CM | POA: Diagnosis not present

## 2024-03-11 DIAGNOSIS — Z0000 Encounter for general adult medical examination without abnormal findings: Secondary | ICD-10-CM | POA: Diagnosis not present

## 2024-03-11 DIAGNOSIS — Z7984 Long term (current) use of oral hypoglycemic drugs: Secondary | ICD-10-CM

## 2024-03-11 DIAGNOSIS — E1159 Type 2 diabetes mellitus with other circulatory complications: Secondary | ICD-10-CM | POA: Diagnosis not present

## 2024-03-11 DIAGNOSIS — E039 Hypothyroidism, unspecified: Secondary | ICD-10-CM | POA: Diagnosis not present

## 2024-03-11 DIAGNOSIS — E1169 Type 2 diabetes mellitus with other specified complication: Secondary | ICD-10-CM

## 2024-03-11 DIAGNOSIS — I152 Hypertension secondary to endocrine disorders: Secondary | ICD-10-CM

## 2024-03-11 DIAGNOSIS — E785 Hyperlipidemia, unspecified: Secondary | ICD-10-CM

## 2024-03-11 LAB — HM DIABETES FOOT EXAM

## 2024-03-11 MED ORDER — OZEMPIC (2 MG/DOSE) 8 MG/3ML ~~LOC~~ SOPN
2.0000 mg | PEN_INJECTOR | SUBCUTANEOUS | 3 refills | Status: AC
  Filled 2024-03-11: qty 9, 84d supply, fill #0

## 2024-03-11 NOTE — Assessment & Plan Note (Addendum)
 Chronic, poor control.. restart semaglutide .  Semaglutide  2 mg weekly and metformin  XL 500 mg daily.  Eye exam  uptodate  Foot exam performed today.  Associated with HTN.

## 2024-03-11 NOTE — Assessment & Plan Note (Addendum)
 Chronic, worsend control , given weight gain back off semaglutide . associated with diabetes LDL no longer  at goal less than 100 on simvastatin  40 mg p.o. daily

## 2024-03-11 NOTE — Assessment & Plan Note (Signed)
 Has gained weight back

## 2024-03-11 NOTE — Assessment & Plan Note (Signed)
Stable, chronic.  Continue current medication.  No evidence of orthostatic hypotension so we will continue on lisinopril 10 mg p.o. daily for now as it is beneficial for kidney protection.

## 2024-03-11 NOTE — Progress Notes (Signed)
 Patient ID: [name], male    DOB: 1965-07-05, 58 y.o.   MRN: [id number]  This visit was conducted in person.  BP 126/82 (BP Location: Left Arm, Patient Position: Sitting, Cuff Size: Large)   Pulse 74   Temp 98 F (36.7 C) (Temporal)   Ht 5' 10 (1.778 m)   Wt (!) 362 lb (164.2 kg)   SpO2 98%   BMI 51.94 kg/m    CC:  Chief Complaint  Patient presents with   Annual Exam    Subjective:   HPI: [name] is a 58 y.o. male presenting on 03/11/2024 for Annual Exam The patient presents for complete physical and review of chronic health problems.   Hypertension:  Significant improvement with weight loss on lisinopril  10 mg daily BP Readings from Last 3 Encounters:  03/11/24 126/82  03/11/23 102/60  09/18/22 130/85  Using medication without problems or lightheadedness:  none Chest pain with exertion: none Edema: none Short of breath: none Average home BPs: 105/65 Other issues:  Elevated Cholesterol: LDL almost at goal less than 100 on simvastatin  40 mg daily Lab Results  Component Value Date   CHOL 185 03/05/2024   HDL 45.20 03/05/2024   LDLCALC 118 (H) 03/05/2024   LDLDIRECT 150.4 05/19/2007   TRIG 111.0 03/05/2024   CHOLHDL 4 03/05/2024  Using medications without problems: none Muscle aches:  none Diet compliance:  good Exercise: outdoor work Other complaints:  Diabetes:   worsened, Poor control ... Has been off for 3 months on semaglutide  2 mg weekly No abd pain, no nausea. Also taking metformin  500 mg XR 1 tablet daily Lab Results  Component Value Date   HGBA1C 9.6 (H) 03/05/2024  Using medications without difficulties: Hypoglycemic episodes: none Hyperglycemic episodes: none Feet problems: no ulcers Blood Sugars averaging: FBS 100 eye exam within last year: due  Morbid obesity: He has lost 10 lbs in the last 6 months. Wt Readings from Last 3 Encounters:  03/11/24 (!) 362 lb (164.2 kg)  03/11/23 (!) 332 lb 2 oz (150.7 kg)  09/18/22  (!) 345 lb (156.5 kg)  Body mass index is 51.94 kg/m.   Hypothyroidism:  levothyroxine  200 and a 50 mcg daily. Free t3 and free t4 nml. Lab Results  Component Value Date   TSH 7.72 (H) 03/05/2024   Lab Results  Component Value Date   CHOL 185 03/05/2024   HDL 45.20 03/05/2024   LDLCALC 118 (H) 03/05/2024   LDLDIRECT 150.4 05/19/2007   TRIG 111.0 03/05/2024   CHOLHDL 4 03/05/2024        Relevant past medical, surgical, family and social history reviewed and updated as indicated. Interim medical history since our last visit reviewed. Allergies and medications reviewed and updated. Outpatient Medications Prior to Visit  Medication Sig Dispense Refill   blood glucose meter kit and supplies KIT Dispense based on patient and insurance preference. Use up to four times daily as directed. 1 each 0   levothyroxine  (SYNTHROID ) 200 MCG tablet Take 1 tablet (200 mcg total) by mouth daily before breakfast. 90 tablet 3   levothyroxine  (SYNTHROID ) 50 MCG tablet Take 1 tablet (50 mcg total) by mouth daily. 90 tablet 3   lisinopril  (ZESTRIL ) 10 MG tablet Take 1 tablet (10 mg total) by mouth daily. 90 tablet 3   metFORMIN  (GLUCOPHAGE -XR) 500 MG 24 hr tablet Take 1 tablet (500 mg total) by mouth daily with breakfast. 30 tablet 0   simvastatin  (ZOCOR ) 40 MG tablet Take 1  tablet (40 mg total) by mouth at bedtime. 90 tablet 3   Semaglutide , 2 MG/DOSE, (OZEMPIC , 2 MG/DOSE,) 8 MG/3ML SOPN Inject 2 mg as directed once a week. 3 mL 0   No facility-administered medications prior to visit.     Per HPI unless specifically indicated in ROS section below Review of Systems  Constitutional:  Negative for fatigue and fever.  HENT:  Negative for ear pain.   Eyes:  Negative for pain.  Respiratory:  Negative for cough and shortness of breath.   Cardiovascular:  Negative for chest pain, palpitations and leg swelling.  Gastrointestinal:  Negative for abdominal pain.  Genitourinary:  Negative for dysuria.   Musculoskeletal:  Negative for arthralgias.  Neurological:  Negative for syncope, light-headedness and headaches.  Psychiatric/Behavioral:  Negative for dysphoric mood.    Objective:  BP 126/82 (BP Location: Left Arm, Patient Position: Sitting, Cuff Size: Large)   Pulse 74   Temp 98 F (36.7 C) (Temporal)   Ht 5' 10 (1.778 m)   Wt (!) 362 lb (164.2 kg)   SpO2 98%   BMI 51.94 kg/m   Wt Readings from Last 3 Encounters:  03/11/24 (!) 362 lb (164.2 kg)  03/11/23 (!) 332 lb 2 oz (150.7 kg)  09/18/22 (!) 345 lb (156.5 kg)      Physical Exam Constitutional:      Appearance: He is well-developed.  HENT:     Head: Normocephalic.     Right Ear: Hearing normal.     Left Ear: Hearing normal.     Nose: Nose normal.  Neck:     Thyroid : No thyroid  mass or thyromegaly.     Vascular: No carotid bruit.     Trachea: Trachea normal.  Cardiovascular:     Rate and Rhythm: Normal rate and regular rhythm.     Pulses: Normal pulses.     Heart sounds: Heart sounds not distant. No murmur heard.    No friction rub. No gallop.     Comments: No peripheral edema Pulmonary:     Effort: Pulmonary effort is normal. No respiratory distress.     Breath sounds: Normal breath sounds.  Musculoskeletal:     Right knee: No tenderness.     Left knee: Crepitus present. No bony tenderness. Normal range of motion. No tenderness.  Skin:    General: Skin is warm and dry.     Findings: No rash.  Psychiatric:        Speech: Speech normal.        Behavior: Behavior normal.        Thought Content: Thought content normal.   Diabetic foot exam: Normal inspection No skin breakdown No calluses  Normal DP pulses Normal sensation to light touch and monofilament Nails normal     Results for orders placed or performed in visit on 03/11/24  HM DIABETES FOOT EXAM   Collection Time: 03/11/24 12:00 AM  Result Value Ref Range   HM Diabetic Foot Exam done      COVID 19 screen:  No recent travel or known  exposure to COVID19 The patient denies respiratory symptoms of COVID 19 at this time. The importance of social distancing was discussed today.   Assessment and Plan   The patient's preventative maintenance and recommended screening tests for an annual wellness exam were reviewed in full today. Brought up to date unless services declined.  Counselled on the importance of diet, exercise, and its role in overall health and mortality. The patient's FH and [facility]  was reviewed, including their home life, tobacco status, and drug and alcohol status.   Vaccines: Uptodate except refused COVID, refused flu, Shingrix, prenvar. Prostate Cancer Screen: no early family history  Lab Results  Component Value Date   PSA 0.16 03/05/2024   PSA 0.20 02/06/2023   PSA 0.15 11/01/2021  Colon Cancer Screen:  08/2022 repeat in 3 years, Pyrtle      Smoking Status: none ETOH/ drug use: none/none   HIV screen:  Refused.    EYE exam schedule 09/2023   Problem List Items Addressed This Visit     Hyperlipidemia associated with type 2 diabetes mellitus (HCC)   Chronic, worsend control , given weight gain back off semaglutide . associated with diabetes LDL no longer  at goal less than 100 on simvastatin  40 mg p.o. daily      Relevant Medications   Semaglutide , 2 MG/DOSE, (OZEMPIC , 2 MG/DOSE,) 8 MG/3ML SOPN   Hypertension associated with diabetes (HCC)   Stable, chronic.  Continue current medication.  No evidence of orthostatic hypotension so we will continue on lisinopril  10 mg p.o. daily for now as it is beneficial for kidney protection.      Relevant Medications   Semaglutide , 2 MG/DOSE, (OZEMPIC , 2 MG/DOSE,) 8 MG/3ML SOPN   Hypothyroidism   Chronic, well-controlled on high-dose levothyroxine  250 mcg daily.      Morbid obesity (HCC)   Has gained weight back      Relevant Medications   Semaglutide , 2 MG/DOSE, (OZEMPIC , 2 MG/DOSE,) 8 MG/3ML SOPN   Type 2 diabetes mellitus with other circulatory  complications (HCC)    Chronic, poor control.. restart semaglutide .  Semaglutide  2 mg weekly and metformin  XL 500 mg daily.  Eye exam  uptodate  Foot exam performed today.  Associated with HTN.      Relevant Medications   Semaglutide , 2 MG/DOSE, (OZEMPIC , 2 MG/DOSE,) 8 MG/3ML SOPN   Other Visit Diagnoses       Routine general medical examination at a health care facility    -  Primary           Greig Ring, MD

## 2024-03-11 NOTE — Assessment & Plan Note (Signed)
Chronic, well-controlled on high-dose levothyroxine 250 mcg daily.

## 2024-04-06 ENCOUNTER — Other Ambulatory Visit (HOSPITAL_COMMUNITY): Payer: Self-pay

## 2024-04-06 ENCOUNTER — Encounter: Payer: Self-pay | Admitting: Family Medicine

## 2024-04-06 ENCOUNTER — Ambulatory Visit: Admitting: Family Medicine

## 2024-04-06 VITALS — BP 110/76 | HR 83 | Temp 97.1°F | Ht 70.0 in | Wt 358.2 lb

## 2024-04-06 DIAGNOSIS — H6991 Unspecified Eustachian tube disorder, right ear: Secondary | ICD-10-CM

## 2024-04-06 DIAGNOSIS — Z7985 Long-term (current) use of injectable non-insulin antidiabetic drugs: Secondary | ICD-10-CM

## 2024-04-06 DIAGNOSIS — E1159 Type 2 diabetes mellitus with other circulatory complications: Secondary | ICD-10-CM | POA: Diagnosis not present

## 2024-04-06 MED ORDER — PREDNISONE 20 MG PO TABS
ORAL_TABLET | ORAL | 0 refills | Status: AC
  Filled 2024-04-06: qty 15, 7d supply, fill #0

## 2024-04-06 NOTE — Progress Notes (Signed)
 "   Patient ID: [name], male    DOB: April 05, 1965, 59 y.o.   MRN: [id number]  This visit was conducted in person.  BP 110/76   Pulse 83   Temp (!) 97.1 F (36.2 C) (Temporal)   Ht 5' 10 (1.778 m)   Wt (!) 358 lb 4 oz (162.5 kg)   SpO2 97%   BMI 51.40 kg/m    CC:  Chief Complaint  Patient presents with   Ear Fullness    Right-Has been sick for 2 weeks and has gotten better but now he has trouble hearing out of Right Ear    Subjective:   HPI: [name] is a 59 y.o. male presenting on 04/06/2024 for Ear Fullness (Right-Has been sick for 2 weeks and has gotten better but now he has trouble hearing out of Right Ear)   2 weeks of upper respiratory viral illness, now resolved.   Used sudafed and Mucinex  Now in last 5 days.SABRA started having decreased hearing and ear  pressure.  No face pain.  Mild nasal congestion.  No fever.   CBG have been improved.  Has been losing weight.   On Ozempic  2 mg weekly.      Relevant past medical, surgical, family and social history reviewed and updated as indicated. Interim medical history since our last visit reviewed. Allergies and medications reviewed and updated. Outpatient Medications Prior to Visit  Medication Sig Dispense Refill   blood glucose meter kit and supplies KIT Dispense based on patient and insurance preference. Use up to four times daily as directed. 1 each 0   levothyroxine  (SYNTHROID ) 200 MCG tablet Take 1 tablet (200 mcg total) by mouth daily before breakfast. 90 tablet 3   levothyroxine  (SYNTHROID ) 50 MCG tablet Take 1 tablet (50 mcg total) by mouth daily. 90 tablet 3   lisinopril  (ZESTRIL ) 10 MG tablet Take 1 tablet (10 mg total) by mouth daily. 90 tablet 3   metFORMIN  (GLUCOPHAGE -XR) 500 MG 24 hr tablet Take 1 tablet (500 mg total) by mouth daily with breakfast. 30 tablet 0   Semaglutide , 2 MG/DOSE, (OZEMPIC , 2 MG/DOSE,) 8 MG/3ML SOPN Inject 2 mg as directed once a week. 9 mL 3   simvastatin  (ZOCOR ) 40 MG  tablet Take 1 tablet (40 mg total) by mouth at bedtime. 90 tablet 3   No facility-administered medications prior to visit.     Per HPI unless specifically indicated in ROS section below Review of Systems  Constitutional:  Negative for fatigue and fever.  HENT:  Negative for ear pain.   Eyes:  Negative for pain.  Respiratory:  Negative for cough and shortness of breath.   Cardiovascular:  Negative for chest pain, palpitations and leg swelling.  Gastrointestinal:  Negative for abdominal pain.  Genitourinary:  Negative for dysuria.  Musculoskeletal:  Negative for arthralgias.  Neurological:  Negative for syncope, light-headedness and headaches.  Psychiatric/Behavioral:  Negative for dysphoric mood.    Objective:  BP 110/76   Pulse 83   Temp (!) 97.1 F (36.2 C) (Temporal)   Ht 5' 10 (1.778 m)   Wt (!) 358 lb 4 oz (162.5 kg)   SpO2 97%   BMI 51.40 kg/m   Wt Readings from Last 3 Encounters:  04/06/24 (!) 358 lb 4 oz (162.5 kg)  03/11/24 (!) 362 lb (164.2 kg)  03/11/23 (!) 332 lb 2 oz (150.7 kg)      Physical Exam Vitals reviewed.  Constitutional:  Appearance: He is well-developed.  HENT:     Head: Normocephalic.     Right Ear: Hearing normal. A middle ear effusion is present.     Left Ear: Hearing normal.     Nose: Nose normal.  Neck:     Thyroid : No thyroid  mass or thyromegaly.     Vascular: No carotid bruit.     Trachea: Trachea normal.  Cardiovascular:     Rate and Rhythm: Normal rate and regular rhythm.     Pulses: Normal pulses.     Heart sounds: Heart sounds not distant. No murmur heard.    No friction rub. No gallop.     Comments: No peripheral edema Pulmonary:     Effort: Pulmonary effort is normal. No respiratory distress.     Breath sounds: Normal breath sounds.  Skin:    General: Skin is warm and dry.     Findings: No rash.  Psychiatric:        Speech: Speech normal.        Behavior: Behavior normal.        Thought Content: Thought content  normal.       Results for orders placed or performed in visit on 03/11/24  [facility] DIABETES FOOT EXAM   Collection Time: 03/11/24 12:00 AM  Result Value Ref Range   [facility] Diabetic Foot Exam done     Assessment and Plan  ETD (Eustachian tube dysfunction), right Assessment & Plan: Acute, likely initial viral infection now resolved with remaining effusion behind right TM. Will treat eustachian tube dysfunction with prednisone  taper.  Patient's diabetes has been much improved control given restarting Ozempic . Reviewed possible blood sugar elevations and encouraged increased water and carbohydrate restrictions while on prednisone .  Return and ER precautions provided.   Type 2 diabetes mellitus with other circulatory complications (HCC)  Other orders -     predniSONE ; 3 tabs by mouth daily x 3 days, then 2 tabs by mouth daily x 2 days then 1 tab by mouth daily x 2 days  Dispense: 15 tablet; Refill: 0    No follow-ups on file.   Greig Ring, MD  "

## 2024-04-06 NOTE — Assessment & Plan Note (Signed)
 Acute, likely initial viral infection now resolved with remaining effusion behind right TM. Will treat eustachian tube dysfunction with prednisone  taper.  Patient's diabetes has been much improved control given restarting Ozempic . Reviewed possible blood sugar elevations and encouraged increased water and carbohydrate restrictions while on prednisone .  Return and ER precautions provided.

## 2024-04-07 ENCOUNTER — Other Ambulatory Visit: Payer: Self-pay

## 2024-04-16 ENCOUNTER — Other Ambulatory Visit: Payer: Self-pay | Admitting: Family Medicine

## 2024-04-16 ENCOUNTER — Other Ambulatory Visit: Payer: Self-pay

## 2024-04-16 DIAGNOSIS — E1169 Type 2 diabetes mellitus with other specified complication: Secondary | ICD-10-CM

## 2024-04-16 DIAGNOSIS — E1159 Type 2 diabetes mellitus with other circulatory complications: Secondary | ICD-10-CM

## 2024-04-16 MED ORDER — METFORMIN HCL ER 500 MG PO TB24
500.0000 mg | ORAL_TABLET | Freq: Every day | ORAL | 0 refills | Status: AC
  Filled 2024-04-16: qty 90, 90d supply, fill #0

## 2024-04-16 NOTE — Telephone Encounter (Signed)
 Please schedule Diabetes follow up with Dr. Avelina a couple days after his lab appointment on 06/09/2024.

## 2024-04-16 NOTE — Telephone Encounter (Signed)
 Last AVS states:  Return in about 3 months (around 06/09/2024) for diabetes follow up fasting labs.   He is scheduled for labs only.  Was he also suppose to see you in 3 months or just labs?

## 2024-04-29 NOTE — Telephone Encounter (Signed)
 Called pt and schedule appt

## 2024-06-09 ENCOUNTER — Other Ambulatory Visit

## 2024-06-15 ENCOUNTER — Ambulatory Visit: Admitting: Family Medicine
